# Patient Record
Sex: Male | Born: 1965
Health system: Southern US, Community
[De-identification: ages and names within clinical notes are randomized; demographics above are authoritative.]

## PROBLEM LIST (undated history)

## (undated) DIAGNOSIS — I4891 Unspecified atrial fibrillation: Principal | ICD-10-CM

## (undated) DIAGNOSIS — K5792 Diverticulitis of intestine, part unspecified, without perforation or abscess without bleeding: Secondary | ICD-10-CM

## (undated) DIAGNOSIS — G4730 Sleep apnea, unspecified: Secondary | ICD-10-CM

## (undated) DIAGNOSIS — E785 Hyperlipidemia, unspecified: Secondary | ICD-10-CM

## (undated) HISTORY — DX: Unspecified atrial fibrillation: I48.91

## (undated) HISTORY — PX: MOLE REMOVAL: SHX2046

## (undated) HISTORY — DX: Sleep apnea, unspecified: G47.30

## (undated) HISTORY — PX: TONSILLECTOMY: SUR1361

## (undated) HISTORY — DX: Diverticulitis of intestine, part unspecified, without perforation or abscess without bleeding: K57.92

## (undated) HISTORY — PX: NASAL SINUS SURGERY: SHX719

## (undated) HISTORY — DX: Hyperlipidemia, unspecified: E78.5

## (undated) HISTORY — PX: MANDIBLE FRACTURE SURGERY: SHX706

---

## 2008-09-15 ENCOUNTER — Emergency Department: Payer: Self-pay | Admitting: Emergency Medicine

## 2010-05-28 ENCOUNTER — Ambulatory Visit: Payer: Self-pay | Admitting: Otolaryngology

## 2010-06-06 ENCOUNTER — Ambulatory Visit: Payer: Self-pay | Admitting: Otolaryngology

## 2010-06-23 ENCOUNTER — Emergency Department: Payer: Self-pay | Admitting: Emergency Medicine

## 2010-06-28 ENCOUNTER — Ambulatory Visit: Payer: Self-pay | Admitting: General Practice

## 2011-07-21 ENCOUNTER — Observation Stay: Payer: Self-pay | Admitting: Internal Medicine

## 2011-07-21 DIAGNOSIS — I4891 Unspecified atrial fibrillation: Secondary | ICD-10-CM

## 2011-07-25 ENCOUNTER — Encounter: Payer: Self-pay | Admitting: Cardiovascular Disease

## 2011-07-25 ENCOUNTER — Encounter: Payer: Self-pay | Admitting: *Deleted

## 2011-07-25 ENCOUNTER — Ambulatory Visit (INDEPENDENT_AMBULATORY_CARE_PROVIDER_SITE_OTHER): Payer: BC Managed Care – PPO | Admitting: Cardiovascular Disease

## 2011-07-25 VITALS — BP 111/72 | HR 62 | Ht 70.0 in | Wt 214.0 lb

## 2011-07-25 DIAGNOSIS — I4891 Unspecified atrial fibrillation: Secondary | ICD-10-CM

## 2011-07-25 DIAGNOSIS — G4733 Obstructive sleep apnea (adult) (pediatric): Secondary | ICD-10-CM | POA: Insufficient documentation

## 2011-07-25 DIAGNOSIS — E785 Hyperlipidemia, unspecified: Secondary | ICD-10-CM | POA: Insufficient documentation

## 2011-07-25 NOTE — Patient Instructions (Addendum)
You are doing well. Hold cardizem and pradaxa for now and restart if pulse is irregular Please call us if you have new issues that need to be addressed before your next appt.  We will call you for a follow up Appt. In 12 months

## 2011-07-25 NOTE — Assessment & Plan Note (Signed)
Would continue the cholesterol medication.

## 2011-07-25 NOTE — Assessment & Plan Note (Addendum)
We did talk about his possible obstructive sleep apnea. We have suggested he contact his insurance company and find out which sleep centers would be covered If you would like to pursue a sleep study. The sleep disorder is a possible cause of his atrial fibrillation. His wife reports he has significant snoring.

## 2011-07-25 NOTE — Assessment & Plan Note (Addendum)
No further episodes of atrial fibrillation. He would like to hold his new medications including Cardizem and pradaxa. We have suggested if he holds the Cardizem, that he closely monitor his heart rate for paroxysmal atrial fibrillation. He should restart the anticoagulation And Cardizem if he has an irregular pulse And call our office.  There is no restrictions for his activities and he can go back to work at any time. No further workup is needed at this time.

## 2011-07-25 NOTE — Progress Notes (Signed)
   Patient ID: Perry Richardson, male    DOB: 07-Aug-1966, 45 y.o.   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 Cardiology was consult in for atrial fibrillation. Suspected history of obstructive sleep apnea.  During his hospital course, it was felt that he had been in atrial fibrillation for at least 24 hours. He had felt some palpitations, shortness of breath with exertion the day prior. No prior history of tachyarrhythmias. He was started on diltiazem infusion and changed to p.o. Cardizem in the morning. he converted to sinus rhythm shortly after. On admission, in the emergency room, he had had a vagal response to a blood draw.  He presents today for routine followup. He denies any palpitations since discharge. Otherwise he feels well. He would like to discontinue the medications.  Echocardiogram in the hospital was essentially normal with normal LV systolic function/  EKG today shows normal sinus rhythm with rate 62 beats per minute with no significant ST or T wave changes   Outpatient Encounter Prescriptions as of 07/25/2011  Medication Sig Dispense Refill  . AXIRON 30 MG/ACT SOLN as directed.      . dabigatran (PRADAXA) 150 MG CAPS Take 150 mg by mouth every 12 (twelve) hours.        Marland Kitchen diltiazem (CARDIZEM) 120 MG tablet Take 120 mg by mouth daily.        . fluticasone (FLONASE) 50 MCG/ACT nasal spray Place 2 sprays into the nose daily.        . simvastatin (ZOCOR) 10 MG tablet Take 1 tablet by mouth Daily.         Review of Systems  Constitutional: Negative.   HENT: Negative.   Eyes: Negative.   Respiratory: Negative.   Cardiovascular: Negative.   Gastrointestinal: Negative.   Musculoskeletal: Negative.   Skin: Negative.   Neurological: Negative.   Hematological: Negative.   Psychiatric/Behavioral: Negative.   All other systems reviewed and are  negative.    BP 111/72  Pulse 62  Ht 5\' 10"  (1.778 m)  Wt 214 lb (97.07 kg)  BMI 30.71 kg/m2  Physical Exam  Nursing note and vitals reviewed. Constitutional: He is oriented to person, place, and time. He appears well-developed and well-nourished.  HENT:  Head: Normocephalic.  Nose: Nose normal.  Mouth/Throat: Oropharynx is clear and moist.  Eyes: Conjunctivae are normal. Pupils are equal, round, and reactive to light.  Neck: Normal range of motion. Neck supple. No JVD present.  Cardiovascular: Normal rate, regular rhythm, S1 normal, S2 normal, normal heart sounds and intact distal pulses.  Exam reveals no gallop and no friction rub.   No murmur heard. Pulmonary/Chest: Effort normal and breath sounds normal. No respiratory distress. He has no wheezes. He has no rales. He exhibits no tenderness.  Abdominal: Soft. Bowel sounds are normal. He exhibits no distension. There is no tenderness.  Musculoskeletal: Normal range of motion. He exhibits no edema and no tenderness.  Lymphadenopathy:    He has no cervical adenopathy.  Neurological: He is alert and oriented to person, place, and time. Coordination normal.  Skin: Skin is warm and dry. No rash noted. No erythema.  Psychiatric: He has a normal mood and affect. His behavior is normal. Judgment and thought content normal.           Assessment and Plan

## 2011-08-01 ENCOUNTER — Encounter: Payer: Self-pay | Admitting: Cardiovascular Disease

## 2012-12-29 ENCOUNTER — Ambulatory Visit: Payer: Self-pay | Admitting: General Practice

## 2013-01-19 ENCOUNTER — Ambulatory Visit: Payer: Self-pay | Admitting: General Practice

## 2013-06-23 ENCOUNTER — Encounter: Payer: Self-pay | Admitting: Cardiovascular Disease

## 2013-06-23 ENCOUNTER — Ambulatory Visit (INDEPENDENT_AMBULATORY_CARE_PROVIDER_SITE_OTHER): Payer: BC Managed Care – PPO | Admitting: Cardiovascular Disease

## 2013-06-23 VITALS — BP 112/86 | HR 56 | Ht 70.0 in | Wt 219.8 lb

## 2013-06-23 DIAGNOSIS — R002 Palpitations: Secondary | ICD-10-CM

## 2013-06-23 DIAGNOSIS — G4733 Obstructive sleep apnea (adult) (pediatric): Secondary | ICD-10-CM

## 2013-06-23 DIAGNOSIS — E785 Hyperlipidemia, unspecified: Secondary | ICD-10-CM

## 2013-06-23 DIAGNOSIS — I4891 Unspecified atrial fibrillation: Secondary | ICD-10-CM

## 2013-06-23 NOTE — Assessment & Plan Note (Addendum)
No further episodes of atrial fibrillation. Recent palpitations likely unrelated to atrial fibrillation. We have offered Holter monitor and monitoring using his smartphone. He will manually check his rhythm for now. He is not interested in any medications for palpitations. He will call us in the office if he would like a Holter monitor.

## 2013-06-23 NOTE — Assessment & Plan Note (Signed)
He reports that he has routine lab work through the [facility]. We'll try to obtain a copy of these results when they become available.

## 2013-06-23 NOTE — Progress Notes (Signed)
Patient ID: Perry Richardson, male    DOB: 1965-12-08, 47 y.o.   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 Cardiology was consult in for atrial fibrillation. Suspected history of obstructive sleep apnea.  During his hospital course, it was felt that he had been in atrial fibrillation for at least 24 hours. He had felt some palpitations, shortness of breath with exertion the day prior. No prior history of tachyarrhythmias. He was started on diltiazem infusion and changed to p.o. Cardizem in the morning. he converted to sinus rhythm shortly after. On admission, in the emergency room, he had had a vagal response to a blood draw.  He presents today for routine followup. He presents with his wife. He reports occasional palpitations and fluttering in his chest. One episode of a strong, faster heart rate only lasting for a short period of time. Very rare episodes. He is able to workout without any significant symptoms. He has felt better in the past 3 weeks after making the appointment. Otherwise he feels well. On his last clinic visit, he wished to discontinue many of his medications and this was done. Typically his heart rate runs low  Previous Echocardiogram in the hospital was essentially normal with normal LV systolic function  EKG today shows normal sinus rhythm with rate 56 beats per minute with no significant ST or T wave changes   Outpatient Encounter Prescriptions as of 06/23/2013  Medication Sig Dispense Refill  . aspirin 81 MG tablet Take 81 mg by mouth daily.      Perry Richardson 30 MG/ACT SOLN as directed.      . finasteride (PROSCAR) 5 MG tablet Take 5 mg by mouth daily.       . fluticasone (FLONASE) 50 MCG/ACT nasal spray Place 2 sprays into the nose daily.        . NON FORMULARY CPAP machine      . simvastatin (ZOCOR) 10 MG tablet Take 1 tablet by mouth Daily.      .  [DISCONTINUED] dabigatran (PRADAXA) 150 MG CAPS Take 150 mg by mouth every 12 (twelve) hours.        . [DISCONTINUED] diltiazem (CARDIZEM) 120 MG tablet Take 120 mg by mouth daily.         No facility-administered encounter medications on file as of 06/23/2013.    Review of Systems  Constitutional: Negative.   HENT: Negative.   Eyes: Negative.   Respiratory: Negative.   Cardiovascular: Positive for palpitations.  Gastrointestinal: Negative.   Musculoskeletal: Negative.   Skin: Negative.   Neurological: Negative.   Psychiatric/Behavioral: Negative.   All other systems reviewed and are negative.    BP 112/86  Pulse 56  Ht 5\' 10"  (1.778 m)  Wt 219 lb 12 oz (99.678 kg)  BMI 31.53 kg/m2  Physical Exam  Nursing note and vitals reviewed. Constitutional: He is oriented to person, place, and time. He appears well-developed and well-nourished.  HENT:  Head: Normocephalic.  Nose: Nose normal.  Mouth/Throat: Oropharynx is clear and moist.  Eyes: Conjunctivae are normal. Pupils are equal, round, and reactive to light.  Neck: Normal range of motion. Neck supple. No JVD present.  Cardiovascular: Normal rate, regular rhythm, S1 normal, S2 normal, normal heart sounds and intact distal pulses.  Exam reveals no gallop and no friction rub.   No murmur heard. Pulmonary/Chest: Effort normal and breath sounds normal.  No respiratory distress. He has no wheezes. He has no rales. He exhibits no tenderness.  Abdominal: Soft. Bowel sounds are normal. He exhibits no distension. There is no tenderness.  Musculoskeletal: Normal range of motion. He exhibits no edema and no tenderness.  Lymphadenopathy:    He has no cervical adenopathy.  Neurological: He is alert and oriented to person, place, and time. Coordination normal.  Skin: Skin is warm and dry. No rash noted. No erythema.  Psychiatric: He has a normal mood and affect. His behavior is normal. Judgment and thought content normal.       Assessment and Plan

## 2013-06-23 NOTE — Assessment & Plan Note (Signed)
Currently tolerating CPAP well

## 2013-06-23 NOTE — Patient Instructions (Addendum)
You are doing well. No medication changes were made.  Please call if you like a holter monitor fo arrhythmia  Please call us if you have new issues that need to be addressed before your next appt.  Your physician wants you to follow-up in: 12 months.  You will receive a reminder letter in the mail two months in advance. If you don't receive a letter, please call our office to schedule the follow-up appointment.

## 2013-07-13 DIAGNOSIS — I4891 Unspecified atrial fibrillation: Secondary | ICD-10-CM

## 2013-07-13 HISTORY — DX: Unspecified atrial fibrillation: I48.91

## 2014-01-10 ENCOUNTER — Ambulatory Visit: Payer: Self-pay | Admitting: Neurology

## 2014-08-14 DIAGNOSIS — R413 Other amnesia: Secondary | ICD-10-CM | POA: Insufficient documentation

## 2014-08-14 DIAGNOSIS — G250 Essential tremor: Secondary | ICD-10-CM | POA: Insufficient documentation

## 2014-09-13 ENCOUNTER — Encounter: Payer: Self-pay | Admitting: Cardiovascular Disease

## 2014-09-13 ENCOUNTER — Ambulatory Visit (INDEPENDENT_AMBULATORY_CARE_PROVIDER_SITE_OTHER): Payer: BC Managed Care – PPO | Admitting: Cardiovascular Disease

## 2014-09-13 VITALS — BP 118/82 | HR 54 | Ht 70.0 in | Wt 225.8 lb

## 2014-09-13 DIAGNOSIS — I4891 Unspecified atrial fibrillation: Secondary | ICD-10-CM

## 2014-09-13 DIAGNOSIS — E785 Hyperlipidemia, unspecified: Secondary | ICD-10-CM

## 2014-09-13 NOTE — Patient Instructions (Signed)
You are doing well. No medication changes were made.  Please call us if you have new issues that need to be addressed before your next appt.  Your physician wants you to follow-up in: 12 months.  You will receive a reminder letter in the mail two months in advance. If you don't receive a letter, please call our office to schedule the follow-up appointment. 

## 2014-09-13 NOTE — Assessment & Plan Note (Signed)
No further episodes of arrhythmia. No further workup at this time

## 2014-09-13 NOTE — Progress Notes (Signed)
Patient ID: KRITHIK MAPEL, male    DOB: 03-06-1966, 48 y.o.   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 Cardiology was consult in for atrial fibrillation. Suspected history of obstructive sleep apnea. He presents today for routine follow-up of arrhythmia, blood pressure  In general he feels well and has no complaints. Denies having any symptoms concerning for atrial fibrillation. Occasional palpitation, possibly from ectopy He is active, weight has been stable. Lab work was reviewed with him. Total cholesterol improved from 190 down to 184. He has 3 children, stays active He is tolerating low-dose simvastatin  typically heart rate runs low, he is asymptomatic  EKG shows normal sinus rhythm with rate 54 bpm, no significant ST or T-wave changes  Other past medical history During his hospital course, it was felt that he had been in atrial fibrillation for at least 24 hours. He had felt some palpitations, shortness of breath with exertion the day prior. No prior history of tachyarrhythmias. He was started on diltiazem infusion and changed to p.o. Cardizem in the morning. he converted to sinus rhythm shortly after. On admission, in the emergency room, he had had a vagal response to a blood draw. Previous Echocardiogram in the hospital was essentially normal with normal LV systolic function    Outpatient Encounter Prescriptions as of 09/13/2014  Medication Sig  . aspirin 81 MG tablet Take 81 mg by mouth daily.  [name] 30 MG/ACT SOLN as directed.  . Cholecalciferol (VITAMIN D) 400 UNITS capsule Take 400 Units by mouth daily.   . Cyanocobalamin (RA VITAMIN B-12 TR) 1000 MCG TBCR Take 1,000 mcg by mouth daily.   . finasteride (PROSCAR) 5 MG tablet Take 5 mg by mouth daily.   . fluticasone (FLONASE) 50 MCG/ACT nasal spray Place 2 sprays into the nose daily.    . NON  FORMULARY CPAP machine  . simvastatin (ZOCOR) 10 MG tablet Take 1 tablet by mouth Daily.    Review of Systems  Constitutional: Negative.   Respiratory: Negative.   Cardiovascular: Positive for palpitations.  Gastrointestinal: Negative.   Musculoskeletal: Negative.   Neurological: Negative.   Psychiatric/Behavioral: Negative.   All other systems reviewed and are negative.    BP 118/82 mmHg  Pulse 54  Ht 5\' 10"  (1.778 m)  Wt 225 lb 12 oz (102.4 kg)  BMI 32.39 kg/m2  Physical Exam  Constitutional: He is oriented to person, place, and time. He appears well-developed and well-nourished.  HENT:  Head: Normocephalic.  Nose: Nose normal.  Mouth/Throat: Oropharynx is clear and moist.  Eyes: Conjunctivae are normal. Pupils are equal, round, and reactive to light.  Neck: Normal range of motion. Neck supple. No JVD present.  Cardiovascular: Normal rate, regular rhythm, S1 normal, S2 normal, normal heart sounds and intact distal pulses.  Exam reveals no gallop and no friction rub.   No murmur heard. Pulmonary/Chest: Effort normal and breath sounds normal. No respiratory distress. He has no wheezes. He has no rales. He exhibits no tenderness.  Abdominal: Soft. Bowel sounds are normal. He exhibits no distension. There is no tenderness.  Musculoskeletal: Normal range of motion. He exhibits no edema or tenderness.  Lymphadenopathy:    He has no cervical adenopathy.  Neurological: He is alert and oriented to person, place, and time. Coordination normal.  Skin: Skin is warm and dry. No rash noted. No erythema.  Psychiatric:  He has a normal mood and affect. His behavior is normal. Judgment and thought content normal.      Assessment and Plan   Nursing note and vitals reviewed.

## 2014-09-13 NOTE — Assessment & Plan Note (Signed)
Cholesterol has improved over the past year. We'll continue simvastatin 10 mg daily. We did discuss increasing up to 20 minute grams daily. He is not particularly interested at this time

## 2015-03-25 ENCOUNTER — Encounter (HOSPITAL_COMMUNITY): Payer: Self-pay | Admitting: *Deleted

## 2015-03-25 ENCOUNTER — Emergency Department (HOSPITAL_COMMUNITY)
Admission: EM | Admit: 2015-03-25 | Discharge: 2015-03-25 | Disposition: A | Discharge status: Home / Self Care | Payer: BLUE CROSS/BLUE SHIELD | Attending: Emergency Medicine | Admitting: Emergency Medicine

## 2015-03-25 ENCOUNTER — Emergency Department (HOSPITAL_COMMUNITY): Payer: BLUE CROSS/BLUE SHIELD

## 2015-03-25 DIAGNOSIS — Z79899 Other long term (current) drug therapy: Secondary | ICD-10-CM | POA: Insufficient documentation

## 2015-03-25 DIAGNOSIS — Z7982 Long term (current) use of aspirin: Secondary | ICD-10-CM | POA: Insufficient documentation

## 2015-03-25 DIAGNOSIS — R61 Generalized hyperhidrosis: Secondary | ICD-10-CM | POA: Diagnosis not present

## 2015-03-25 DIAGNOSIS — R55 Syncope and collapse: Secondary | ICD-10-CM | POA: Diagnosis not present

## 2015-03-25 DIAGNOSIS — Z8669 Personal history of other diseases of the nervous system and sense organs: Secondary | ICD-10-CM | POA: Diagnosis not present

## 2015-03-25 DIAGNOSIS — E785 Hyperlipidemia, unspecified: Secondary | ICD-10-CM | POA: Diagnosis not present

## 2015-03-25 DIAGNOSIS — R1013 Epigastric pain: Secondary | ICD-10-CM

## 2015-03-25 DIAGNOSIS — Z7951 Long term (current) use of inhaled steroids: Secondary | ICD-10-CM | POA: Insufficient documentation

## 2015-03-25 DIAGNOSIS — R079 Chest pain, unspecified: Secondary | ICD-10-CM | POA: Insufficient documentation

## 2015-03-25 DIAGNOSIS — I4891 Unspecified atrial fibrillation: Secondary | ICD-10-CM | POA: Insufficient documentation

## 2015-03-25 LAB — LIPASE, BLOOD: Lipase: 30 U/L (ref 22–51)

## 2015-03-25 LAB — BASIC METABOLIC PANEL
ANION GAP: 11 (ref 5–15)
BUN: 19 mg/dL (ref 6–20)
CALCIUM: 8.9 mg/dL (ref 8.9–10.3)
CO2: 24 mmol/L (ref 22–32)
CREATININE: 1.28 mg/dL — AB (ref 0.61–1.24)
Chloride: 104 mmol/L (ref 101–111)
GFR calc non Af Amer: >60 mL/min (ref >60)
GLUCOSE: 113 mg/dL — AB (ref 65–99)
Potassium: 3.2 mmol/L — ABNORMAL LOW (ref 3.5–5.1)
Sodium: 139 mmol/L (ref 135–145)

## 2015-03-25 LAB — CBC WITH DIFFERENTIAL/PLATELET
Basophils Absolute: 0.1 10*3/uL (ref 0.0–0.1)
Basophils Relative: 1 % (ref 0–1)
Eosinophils Absolute: 0.2 10*3/uL (ref 0.0–0.7)
Eosinophils Relative: 4 % (ref 0–5)
HCT: 44 % (ref 39.0–52.0)
HEMOGLOBIN: 15.2 g/dL (ref 13.0–17.0)
LYMPHS ABS: 1.9 10*3/uL (ref 0.7–4.0)
Lymphocytes Relative: 33 % (ref 12–46)
MCH: 31.1 pg (ref 26.0–34.0)
MCHC: 34.5 g/dL (ref 30.0–36.0)
MCV: 90.2 fL (ref 78.0–100.0)
MONO ABS: 0.4 10*3/uL (ref 0.1–1.0)
Monocytes Relative: 7 % (ref 3–12)
Neutro Abs: 3.2 10*3/uL (ref 1.7–7.7)
Neutrophils Relative %: 55 % (ref 43–77)
Platelets: 168 10*3/uL (ref 150–400)
RBC: 4.88 MIL/uL (ref 4.22–5.81)
RDW: 12.8 % (ref 11.5–15.5)
WBC: 5.7 10*3/uL (ref 4.0–10.5)

## 2015-03-25 LAB — TROPONIN I

## 2015-03-25 MED ORDER — OMEPRAZOLE 20 MG PO CPDR: 20.0000 mg | DELAYED_RELEASE_CAPSULE | Freq: Every day | ORAL | Status: DC

## 2015-03-25 MED ORDER — GI COCKTAIL ~~LOC~~
30.0000 mL | Freq: Once | ORAL | Status: AC
  Administered 2015-03-25: 30 mL via ORAL
  Filled 2015-03-25: qty 30

## 2015-03-25 NOTE — ED Notes (Signed)
Called lab, they are adding on lipase.

## 2015-03-25 NOTE — ED Provider Notes (Signed)
CSN: [id number]     Arrival date & time 03/25/15  1848 History   First MD Initiated Contact with Patient 03/25/15 2110     Chief Complaint  Patient presents with  . Chest Pain     (Consider location/radiation/quality/duration/timing/severity/associated sxs/prior Treatment) Patient is a 49 y.o. male presenting with chest pain. The history is provided by the patient.  Chest Pain Pain location:  Epigastric Pain quality: aching   Pain radiates to:  Does not radiate Pain radiates to the back: no   Pain severity:  Moderate Onset quality:  Gradual (6:15 pm) Duration:  3 hours Timing:  Constant Progression:  Worsening Chronicity:  New Context: at rest   Context: not breathing and no trauma   Context comment:  S/p beer drinking Relieved by:  Nothing Worsened by:  Nothing tried Ineffective treatments:  None tried Associated symptoms: diaphoresis (en route to ED), heartburn and near-syncope   Associated symptoms: no fever and no weakness   Risk factors: high cholesterol, hypertension and male sex   Risk factors: no coronary artery disease     Past Medical History  Diagnosis Date  . Atrial fibrillation   . Hyperlipidemia   . Sleep apnea    History reviewed. No pertinent past surgical history. Family History  Problem Relation Age of Onset  . Family history unknown: Yes   History  Substance Use Topics  . Smoking status: Never Smoker   . Smokeless tobacco: Not on file  . Alcohol Use: Yes     Comment: rare    Review of Systems  Constitutional: Positive for diaphoresis (en route to ED). Negative for fever.  Cardiovascular: Positive for chest pain and near-syncope.  Gastrointestinal: Positive for heartburn.  Neurological: Negative for weakness.  All other systems reviewed and are negative.     Allergies  Review of patient's allergies indicates no known allergies.  Home Medications   Prior to Admission medications   Medication Sig Start Date End Date Taking?  Authorizing Provider  aspirin 81 MG tablet Take 81 mg by mouth daily.    Historical Provider, MD  AXIRON 30 MG/ACT SOLN as directed. 07/10/11   Historical Provider, MD  Cholecalciferol (VITAMIN D) 400 UNITS capsule Take 400 Units by mouth daily.     Historical Provider, MD  Cyanocobalamin (RA VITAMIN B-12 TR) 1000 MCG TBCR Take 1,000 mcg by mouth daily.     Historical Provider, MD  finasteride (PROSCAR) 5 MG tablet Take 5 mg by mouth daily.  06/17/13   Historical Provider, MD  fluticasone (FLONASE) 50 MCG/ACT nasal spray Place 2 sprays into the nose daily.      Historical Provider, MD  NON FORMULARY CPAP machine    Historical Provider, MD  omeprazole (PRILOSEC) 20 MG capsule Take 1 capsule (20 mg total) by mouth daily. 03/25/15   Leo Grosser, MD  simvastatin (ZOCOR) 10 MG tablet Take 1 tablet by mouth Daily. 07/18/11   Historical Provider, MD   BP 103/57 mmHg  Pulse 109  Temp(Src) 97.4 F (36.3 C)  Resp 16  Ht 5\' 10"  (1.778 m)  Wt 220 lb (99.791 kg)  BMI 31.57 kg/m2  SpO2 100% Physical Exam  Constitutional: He is oriented to person, place, and time. He appears well-developed and well-nourished. No distress.  HENT:  Head: Normocephalic and atraumatic.  Eyes: Conjunctivae are normal.  Neck: Neck supple. No tracheal deviation present.  Cardiovascular: Normal rate and regular rhythm.   Pulmonary/Chest: Effort normal. No respiratory distress.  Abdominal: Soft. He exhibits no  distension.  Neurological: He is alert and oriented to person, place, and time.  Skin: Skin is warm and dry.  Psychiatric: He has a normal mood and affect.    ED Course  Procedures (including critical care time) Labs Review Labs Reviewed  BASIC METABOLIC PANEL - Abnormal; Notable for the following:    Potassium 3.2 (*)    Glucose, Bld 113 (*)    Creatinine, Ser 1.28 (*)    All other components within normal limits  TROPONIN I  CBC WITH DIFFERENTIAL/PLATELET  LIPASE, BLOOD  CBC WITH DIFFERENTIAL/PLATELET     Imaging Review Dg Chest 2 View  03/25/2015   CLINICAL DATA:  Acute chest pain and nausea today.  EXAM: CHEST  2 VIEW  COMPARISON:  None.  FINDINGS: The cardiomediastinal silhouette is unremarkable.  There is no evidence of focal airspace disease, pulmonary edema, suspicious pulmonary nodule/mass, pleural effusion, or pneumothorax. No acute bony abnormalities are identified.  IMPRESSION: No active cardiopulmonary disease.   Electronically Signed   By: Margarette Canada M.D.   On: 03/25/2015 19:59   I independently viewed and interpreted the above radiology studies and agree with radiologist report.   EKG Interpretation   Date/Time:  Sunday March 25 2015 18:53:02 EDT Ventricular Rate:  52 PR Interval:  158 QRS Duration: 90 QT Interval:  434 QTC Calculation: 403 R Axis:   65 Text Interpretation:  Sinus bradycardia Nonspecific T wave abnormality  Abnormal ECG When compared with ECG of 07/20/2011, Sinus rhythm has  replaced Atrial fibrillation Nonspecific T wave abnormality is now Present  HEART RATE has decreased Confirmed by [facility]  MD, DAVID ([id number]) on  03/25/2015 9:57:08 PM      MDM   Final diagnoses:  Epigastric abdominal pain    49 year old male presents after drinking a beer at a baseball game and getting sudden onset upper abdominal sharp pain that happened gradually and worsened. He has noted this pain twice previously, once while drinking beer and another time while exercising.  Symptoms concerning for reflux esophagitis, peptic ulcer disease, or pancreatitis. Lipase is normal, pain is not related to intake of food, pain is extremely atypical for ACS and patient has a negative troponin on arrival and no ischemic findings on EKG while having symptoms. Reflux esophagitis or other pain related to beer intake is much higher likelihood. The patient will be advised to stop beer consumption and start a PPI empirically. Plan to follow up with PCP as needed and return precautions discussed for  worsening or new concerning symptoms.   Leo Grosser, MD 22/63/33 [id number]  Delora Fuel, MD 25/63/89 [id number]

## 2015-03-25 NOTE — ED Notes (Signed)
The pt is c/o abd pain with radiation up into his chest for approx one hour with nausea.  He mowed his yard this am and this afternoon he was watching a ballgame and he had 2-3 beers to drink.Perry Richardson  Hx af  Not tachy

## 2015-03-25 NOTE — ED Notes (Signed)
Pt reports mid epigastric and upper abdominal pain for several weeks. Off and on instances of sharp chest pain causing diaphoresis, weakness, some shortness of breath and nausea after drinking alcohol several times throughout the past few weeks.

## 2015-03-25 NOTE — Discharge Instructions (Signed)

## 2015-03-26 ENCOUNTER — Telehealth: Payer: Self-pay | Admitting: General Surgery

## 2015-03-26 NOTE — Telephone Encounter (Addendum)
03-26-15 PT CALLED TODAY & WANTED TO MAKE AN APPT FOR A COLONOSCOPY(AGE/NON-PRIOR) & WAS SEEN IN [facility] ER 03-25-15(IN [facility]) HE HAS BEEN HAVING UPPER GASTRIC & CHEST PRESSURE WITH NAUSEA & SWEATING.HE WOULD LIKE TO SEE YOU FOR BOTH OF THESE.THIS IS CATERINE Bouch(HAPPY) SPOUSE & LAST SEEN  BY YOU IN 2002.CAN WE MAKE APPT?

## 2015-03-27 ENCOUNTER — Encounter: Payer: Self-pay | Admitting: General Surgery

## 2015-03-27 NOTE — Telephone Encounter (Signed)
OK 

## 2015-04-04 ENCOUNTER — Ambulatory Visit (INDEPENDENT_AMBULATORY_CARE_PROVIDER_SITE_OTHER): Payer: BLUE CROSS/BLUE SHIELD | Admitting: General Surgery

## 2015-04-04 ENCOUNTER — Encounter: Payer: Self-pay | Admitting: General Surgery

## 2015-04-04 DIAGNOSIS — K224 Dyskinesia of esophagus: Secondary | ICD-10-CM

## 2015-04-04 DIAGNOSIS — Z8371 Family history of colonic polyps: Secondary | ICD-10-CM | POA: Diagnosis not present

## 2015-04-04 MED ORDER — HYOSCYAMINE SULFATE 0.125 MG SL SUBL: 0.1250 mg | SUBLINGUAL_TABLET | SUBLINGUAL | Status: DC | PRN

## 2015-04-04 MED ORDER — POLYETHYLENE GLYCOL 3350 17 GM/SCOOP PO POWD: ORAL | Status: DC

## 2015-04-04 NOTE — Patient Instructions (Addendum)
Esophagogastroduodenoscopy Esophagogastroduodenoscopy (EGD) is a procedure to examine the lining of the esophagus, stomach, and first part of the small intestine (duodenum). A long, flexible, lighted tube with a camera attached (endoscope) is inserted down the throat to view these organs. This procedure is done to detect problems or abnormalities, such as inflammation, bleeding, ulcers, or growths, in order to treat them. The procedure lasts about 5-20 minutes. It is usually an outpatient procedure, but it may need to be performed in emergency cases in the hospital. LET YOUR CAREGIVER KNOW ABOUT:   Allergies to food or medicine.  All medicines you are taking, including vitamins, herbs, eyedrops, and over-the-counter medicines and creams.  Use of steroids (by mouth or creams).  Previous problems you or members of your family have had with the use of anesthetics.  Any blood disorders you have.  Previous surgeries you have had.  Other health problems you have.  Possibility of pregnancy, if this applies. RISKS AND COMPLICATIONS  Generally, EGD is a safe procedure. However, as with any procedure, complications can occur. Possible complications include:  Infection.  Bleeding.  Tearing (perforation) of the esophagus, stomach, or duodenum.  Difficulty breathing or not being able to breath.  Excessive sweating.  Spasms of the larynx.  Slowed heartbeat.  Low blood pressure. BEFORE THE PROCEDURE  Do not eat or drink anything for 6-8 hours before the procedure or as directed by your caregiver.  Ask your caregiver about changing or stopping your regular medicines.  If you wear dentures, be prepared to remove them before the procedure.  Arrange for someone to drive you home after the procedure. PROCEDURE   A vein will be accessed to give medicines and fluids. A medicine to relax you (sedative) and a pain reliever will be given through that access into the vein.  A numbing medicine  (local anesthetic) may be sprayed on your throat for comfort and to stop you from gagging or coughing.  A mouth guard may be placed in your mouth to protect your teeth and to keep you from biting on the endoscope.  You will be asked to lie on your left side.  The endoscope is inserted down your throat and into the esophagus, stomach, and duodenum.  Air is put through the endoscope to allow your caregiver to view the lining of your esophagus clearly.  The esophagus, stomach, and duodenum is then examined. During the exam, your caregiver may:  Remove tissue to be examined under a microscope (biopsy) for inflammation, infection, or other medical problems.  Remove growths.  Remove objects (foreign bodies) that are stuck.  Treat any bleeding with medicines or other devices that stop tissues from bleeding (hot cautery, clipping devices).  Widen (dilate) or stretch narrowed areas of the esophagus and stomach.  The endoscope will then be withdrawn. AFTER THE PROCEDURE  You will be taken to a recovery area to be monitored. You will be able to go home once you are stable and alert.  Do not eat or drink anything until the local anesthetic and numbing medicines have worn off. You may choke.  It is normal to feel bloated, have pain with swallowing, or have a sore throat for a short time. This will wear off.  Your caregiver should be able to discuss his or her findings with you. It will take longer to discuss the test results if any biopsies were taken. Document Released: 01/30/2005 Document Revised: 02/13/2014 Document Reviewed: 09/01/2012 [facility] Patient Information 2015 [place], [place]. This information is not  intended to replace advice given to you by your health care provider. Make sure you discuss any questions you have with your health care provider.     Colonoscopy A colonoscopy is an exam to look at the entire large intestine (colon). This exam can help find problems such as  tumors, polyps, inflammation, and areas of bleeding. The exam takes about 1 hour.  LET [facility] CARE PROVIDER KNOW ABOUT:   Any allergies you have.  All medicines you are taking, including vitamins, herbs, eye drops, creams, and over-the-counter medicines.  Previous problems you or members of your family have had with the use of anesthetics.  Any blood disorders you have.  Previous surgeries you have had.  Medical conditions you have. RISKS AND COMPLICATIONS  Generally, this is a safe procedure. However, as with any procedure, complications can occur. Possible complications include:  Bleeding.  Tearing or rupture of the colon wall.  Reaction to medicines given during the exam.  Infection (rare). BEFORE THE PROCEDURE   Ask your health care provider about changing or stopping your regular medicines.  You may be prescribed an oral bowel prep. This involves drinking a large amount of medicated liquid, starting the day before your procedure. The liquid will cause you to have multiple loose stools until your stool is almost clear or light green. This cleans out your colon in preparation for the procedure.  Do not eat or drink anything else once you have started the bowel prep, unless your health care provider tells you it is safe to do so.  Arrange for someone to drive you home after the procedure. PROCEDURE   You will be given medicine to help you relax (sedative).  You will lie on your side with your knees bent.  A long, flexible tube with a light and camera on the end (colonoscope) will be inserted through the rectum and into the colon. The camera sends video back to a computer screen as it moves through the colon. The colonoscope also releases carbon dioxide gas to inflate the colon. This helps your health care provider see the area better.  During the exam, your health care provider may take a small tissue sample (biopsy) to be examined under a microscope if any  abnormalities are found.  The exam is finished when the entire colon has been viewed. AFTER THE PROCEDURE   Do not drive for 24 hours after the exam.  You may have a small amount of blood in your stool.  You may pass moderate amounts of gas and have mild abdominal cramping or bloating. This is caused by the gas used to inflate your colon during the exam.  Ask when your test results will be ready and how you will get your results. Make sure you get your test results. Document Released: 09/26/2000 Document Revised: 07/20/2013 Document Reviewed: 06/06/2013 [facility] Patient Information 2015 [place], [place]. This information is not intended to replace advice given to you by your health care provider. Make sure you discuss any questions you have with your health care provider.  Patient has been scheduled for a colonoscopy on 05-30-15 at [facility]. It is okay for patient to continue 81 mg aspirin once daily.

## 2015-04-04 NOTE — Progress Notes (Signed)
Patient ID: Perry Richardson, male   DOB: 09-Oct-1966, 49 y.o.   MRN: [id number]  Chief Complaint  Patient presents with  . Colonoscopy    HPI Perry Richardson is a 49 y.o. male.  Here today to discuss having a colonoscopy. He does have upper gastric "spasms" associated with alcohol and heat. States it last 20 minutes with shortness of breath. He states 2 episodes in past month. He did go to [facility] ED and was negative for cardiac issues. He states he does not tolerate greasy foods they tend to trigger "gas" and pressure. Cold coffee triggers irregular bowels for a week. He does admit to hemorrhoids. Bowels are regular and daily, no bleeding.  HPI  Past Medical History  Diagnosis Date  . Atrial fibrillation Oct 2014    one episode  . Hyperlipidemia   . Sleep apnea     CPAP    Past Surgical History  Procedure Laterality Date  . Nasal sinus surgery      x2    Family History  Problem Relation Age of Onset  . Hypertension Father   . COPD Mother   . Cancer Mother     kidney  . Cancer Other 25    niece/rectal cancer  . Colon polyps Daughter 39    colon polyp    Social History History  Substance Use Topics  . Smoking status: Never Smoker   . Smokeless tobacco: Never Used  . Alcohol Use: 0.0 oz/week    0 Standard drinks or equivalent per week     Comment: rare, 2/week    No Known Allergies  Current Outpatient Prescriptions  Medication Sig Dispense Refill  . aspirin 81 MG tablet Take 81 mg by mouth daily.    [name] 30 MG/ACT SOLN as directed.    . Cholecalciferol (VITAMIN D) 400 UNITS capsule Take 400 Units by mouth daily.     . Cyanocobalamin (RA VITAMIN B-12 TR) 1000 MCG TBCR Take 1,000 mcg by mouth daily.     . finasteride (PROSCAR) 5 MG tablet Take 5 mg by mouth daily.     . fluticasone (FLONASE) 50 MCG/ACT nasal spray Place 2 sprays into the nose daily.      . NON FORMULARY CPAP machine    . simvastatin (ZOCOR) 10 MG tablet Take 1 tablet by mouth Daily.    .  hyoscyamine (LEVSIN/SL) 0.125 MG SL tablet Place 1 tablet (0.125 mg total) under the tongue every 4 (four) hours as needed for cramping. 30 tablet 0  . polyethylene glycol powder (GLYCOLAX/MIRALAX) powder 255 grams one bottle for colonoscopy prep 255 g 0   No current facility-administered medications for this visit.    Review of Systems Review of Systems  Respiratory: Negative.   Cardiovascular: Negative.   Gastrointestinal: Negative for vomiting, diarrhea and constipation.    Blood pressure 116/70, pulse 58, resp. rate 12, height 5' 10.5" (1.791 m), weight 223 lb (101.152 kg).  Physical Exam Physical Exam  Constitutional: He is oriented to person, place, and time. He appears well-developed and well-nourished.  HENT:  Mouth/Throat: Oropharynx is clear and moist and mucous membranes are normal.  Eyes: Conjunctivae are normal. No scleral icterus.  Neck: Neck supple.  Cardiovascular: Normal rate, regular rhythm and normal heart sounds.   Pulmonary/Chest: Effort normal and breath sounds normal.  Abdominal: Soft. Normal appearance and bowel sounds are normal. There is no hepatomegaly. There is no tenderness.  Lymphadenopathy:    He has no cervical adenopathy.  Neurological:  He is alert and oriented to person, place, and time.  Skin: Skin is warm and dry.  Psychiatric: He has a normal mood and affect.    Data Reviewed ED evaluation from 03/25/2015 as well as associated laboratory studies were reviewed. The only notable finding was a potassium of 3.2. Normal troponin. Normal ECG. Normal chest x-ray.  Assessment    Clinical history compatible with esophageal spasm.  Family history of colon cancer and colon polyps.    Plan     Discussed risk and benefits of upper endoscopy.  Colonoscopy with possible biopsy/polypectomy prn: Information regarding the procedure, including its potential risks and complications (including but not limited to perforation of the bowel, which may  require emergency surgery to repair, and bleeding) was verbally given to the patient. Educational information regarding lower instestinal endoscopy was given to the patient. Written instructions for how to complete the bowel prep using Miralax were provided. The importance of drinking ample fluids to avoid dehydration as a result of the prep emphasized.    Patient has been scheduled for a colonoscopy on 05-30-15 at [facility]. It is okay for patient to continue 81 mg aspirin once daily.  The patient will contact his brother to determine if his niece, his brother sister, had genetic testing with the identification of colon cancer at age less than 90.  A prescription for Levsin SL was provided to use if he has a recurrent episode of esophageal spasm. He'll report if this is beneficial at the time of his upcoming colonoscopy/upper endoscopy. PCP:  Kandace Parkins 04/05/2015, 6:58 PM

## 2015-04-05 DIAGNOSIS — Z8371 Family history of colonic polyps: Secondary | ICD-10-CM | POA: Insufficient documentation

## 2015-04-05 DIAGNOSIS — K224 Dyskinesia of esophagus: Secondary | ICD-10-CM | POA: Insufficient documentation

## 2015-04-05 NOTE — H&P (Signed)
HPI  Perry Richardson is a 49 y.o. male. Here today to discuss having a colonoscopy. He does have upper gastric "spasms" associated with alcohol and heat. States it last 20 minutes with shortness of breath. He states 2 episodes in past month. He did go to [facility] ED and was negative for cardiac issues.  He states he does not tolerate greasy foods they tend to trigger "gas" and pressure. Cold coffee triggers irregular bowels for a week.  He does admit to hemorrhoids. Bowels are regular and daily, no bleeding.  HPI  Past Medical History   Diagnosis  Date   .  Atrial fibrillation  Oct 2014     one episode   .  Hyperlipidemia    .  Sleep apnea      CPAP    Past Surgical History   Procedure  Laterality  Date   .  Nasal sinus surgery       x2    Family History   Problem  Relation  Age of Onset   .  Hypertension  Father    .  COPD  Mother    .  Cancer  Mother      kidney   .  Cancer  Other  25     niece/rectal cancer   .  Colon polyps  Daughter  29     colon polyp    Social History  History   Substance Use Topics   .  Smoking status:  Never Smoker   .  Smokeless tobacco:  Never Used   .  Alcohol Use:  0.0 oz/week     0 Standard drinks or equivalent per week      Comment: rare, 2/week    No Known Allergies  Current Outpatient Prescriptions   Medication  Sig  Dispense  Refill   .  aspirin 81 MG tablet  Take 81 mg by mouth daily.     [name] 30 MG/ACT SOLN  as directed.     .  Cholecalciferol (VITAMIN D) 400 UNITS capsule  Take 400 Units by mouth daily.     .  Cyanocobalamin (RA VITAMIN B-12 TR) 1000 MCG TBCR  Take 1,000 mcg by mouth daily.     .  finasteride (PROSCAR) 5 MG tablet  Take 5 mg by mouth daily.     .  fluticasone (FLONASE) 50 MCG/ACT nasal spray  Place 2 sprays into the nose daily.     .  NON FORMULARY  CPAP machine     .  simvastatin (ZOCOR) 10 MG tablet  Take 1 tablet by mouth Daily.     .  hyoscyamine (LEVSIN/SL) 0.125 MG SL tablet  Place 1 tablet (0.125 mg total)  under the tongue every 4 (four) hours as needed for cramping.  30 tablet  0   .  polyethylene glycol powder (GLYCOLAX/MIRALAX) powder  255 grams one bottle for colonoscopy prep  255 g  0    No current facility-administered medications for this visit.    Review of Systems  Review of Systems  Respiratory: Negative.  Cardiovascular: Negative.  Gastrointestinal: Negative for vomiting, diarrhea and constipation.   Blood pressure 116/70, pulse 58, resp. rate 12, height 5' 10.5" (1.791 m), weight 223 lb (101.152 kg).  Physical Exam  Physical Exam  Constitutional: He is oriented to person, place, and time. He appears well-developed and well-nourished.  HENT:  Mouth/Throat: Oropharynx is clear and moist and mucous membranes are normal.  Eyes: Conjunctivae  are normal. No scleral icterus.  Neck: Neck supple.  Cardiovascular: Normal rate, regular rhythm and normal heart sounds.  Pulmonary/Chest: Effort normal and breath sounds normal.  Abdominal: Soft. Normal appearance and bowel sounds are normal. There is no hepatomegaly. There is no tenderness.  Lymphadenopathy:  He has no cervical adenopathy.  Neurological: He is alert and oriented to person, place, and time.  Skin: Skin is warm and dry.  Psychiatric: He has a normal mood and affect.   Data Reviewed  ED evaluation from 03/25/2015 as well as associated laboratory studies were reviewed. The only notable finding was a potassium of 3.2. Normal troponin. Normal ECG. Normal chest x-ray.  Assessment   Clinical history compatible with esophageal spasm.  Family history of colon cancer and colon polyps.   Plan   Discussed risk and benefits of upper endoscopy.  Colonoscopy with possible biopsy/polypectomy prn: Information regarding the procedure, including its potential risks and complications (including but not limited to perforation of the bowel, which may require emergency surgery to repair, and bleeding) was verbally given to the patient.  Educational information regarding lower instestinal endoscopy was given to the patient. Written instructions for how to complete the bowel prep using Miralax were provided. The importance of drinking ample fluids to avoid dehydration as a result of the prep emphasized.   Patient has been scheduled for a colonoscopy on 05-30-15 at [facility]. It is okay for patient to continue 81 mg aspirin once daily.  The patient will contact his brother to determine if his niece, his brother sister, had genetic testing with the identification of colon cancer at age less than 6.  A prescription for Levsin SL was provided to use if he has a recurrent episode of esophageal spasm. He'll report if this is beneficial at the time of his upcoming colonoscopy/upper endoscopy.  PCP: Kandace Parkins

## 2015-05-23 ENCOUNTER — Telehealth: Payer: Self-pay | Admitting: *Deleted

## 2015-05-23 NOTE — Telephone Encounter (Signed)
Message left on cell number for patient to call the office.   We need to confirm no medication changes since last office visit. Also, confirm he has Miralax prescription.   Patient is scheduled for an upper and lower endoscopy on 05-30-15 at [facility].

## 2015-05-23 NOTE — Telephone Encounter (Signed)
Patient confirms no medication changes since last office visit. Also, states he has Miralax prescription.   We will proceed with colonoscopy as scheduled.

## 2015-05-30 ENCOUNTER — Encounter
Admission: RE | Disposition: A | Discharge status: Home / Self Care | Payer: Self-pay | Source: Ambulatory Visit | Attending: General Surgery

## 2015-05-30 ENCOUNTER — Ambulatory Visit: Payer: BLUE CROSS/BLUE SHIELD | Admitting: Anesthesiology

## 2015-05-30 ENCOUNTER — Ambulatory Visit
Admission: RE | Admit: 2015-05-30 | Discharge: 2015-05-30 | Disposition: A | Discharge status: Home / Self Care | Payer: BLUE CROSS/BLUE SHIELD | Source: Ambulatory Visit | Attending: General Surgery | Admitting: General Surgery

## 2015-05-30 ENCOUNTER — Encounter: Payer: Self-pay | Admitting: *Deleted

## 2015-05-30 DIAGNOSIS — G4730 Sleep apnea, unspecified: Secondary | ICD-10-CM | POA: Insufficient documentation

## 2015-05-30 DIAGNOSIS — R1013 Epigastric pain: Secondary | ICD-10-CM | POA: Diagnosis present

## 2015-05-30 DIAGNOSIS — Z825 Family history of asthma and other chronic lower respiratory diseases: Secondary | ICD-10-CM | POA: Diagnosis not present

## 2015-05-30 DIAGNOSIS — Z7982 Long term (current) use of aspirin: Secondary | ICD-10-CM | POA: Insufficient documentation

## 2015-05-30 DIAGNOSIS — Z1211 Encounter for screening for malignant neoplasm of colon: Secondary | ICD-10-CM | POA: Insufficient documentation

## 2015-05-30 DIAGNOSIS — Z9889 Other specified postprocedural states: Secondary | ICD-10-CM | POA: Diagnosis not present

## 2015-05-30 DIAGNOSIS — R1030 Lower abdominal pain, unspecified: Secondary | ICD-10-CM | POA: Diagnosis not present

## 2015-05-30 DIAGNOSIS — E785 Hyperlipidemia, unspecified: Secondary | ICD-10-CM | POA: Diagnosis not present

## 2015-05-30 DIAGNOSIS — Z8249 Family history of ischemic heart disease and other diseases of the circulatory system: Secondary | ICD-10-CM | POA: Diagnosis not present

## 2015-05-30 DIAGNOSIS — D126 Benign neoplasm of colon, unspecified: Secondary | ICD-10-CM | POA: Diagnosis not present

## 2015-05-30 DIAGNOSIS — D123 Benign neoplasm of transverse colon: Secondary | ICD-10-CM | POA: Insufficient documentation

## 2015-05-30 DIAGNOSIS — Z7951 Long term (current) use of inhaled steroids: Secondary | ICD-10-CM | POA: Insufficient documentation

## 2015-05-30 DIAGNOSIS — Z8051 Family history of malignant neoplasm of kidney: Secondary | ICD-10-CM | POA: Diagnosis not present

## 2015-05-30 DIAGNOSIS — Z800 Family history of malignant neoplasm of digestive organs: Secondary | ICD-10-CM | POA: Diagnosis not present

## 2015-05-30 DIAGNOSIS — Z8371 Family history of colonic polyps: Secondary | ICD-10-CM

## 2015-05-30 DIAGNOSIS — I4891 Unspecified atrial fibrillation: Secondary | ICD-10-CM | POA: Diagnosis not present

## 2015-05-30 DIAGNOSIS — K319 Disease of stomach and duodenum, unspecified: Secondary | ICD-10-CM

## 2015-05-30 DIAGNOSIS — Z79899 Other long term (current) drug therapy: Secondary | ICD-10-CM | POA: Diagnosis not present

## 2015-05-30 DIAGNOSIS — D122 Benign neoplasm of ascending colon: Secondary | ICD-10-CM | POA: Diagnosis not present

## 2015-05-30 HISTORY — PX: COLONOSCOPY WITH PROPOFOL: SHX5780

## 2015-05-30 HISTORY — PX: ESOPHAGOGASTRODUODENOSCOPY: SHX5428

## 2015-05-30 SURGERY — COLONOSCOPY WITH PROPOFOL
Anesthesia: General

## 2015-05-30 MED ORDER — FENTANYL CITRATE (PF) 100 MCG/2ML IJ SOLN: 25.0000 ug | INTRAMUSCULAR | Status: DC | PRN

## 2015-05-30 MED ORDER — LIDOCAINE HCL (CARDIAC) 20 MG/ML IV SOLN
INTRAVENOUS | Status: DC | PRN
  Administered 2015-05-30: 60 mg via INTRAVENOUS

## 2015-05-30 MED ORDER — GLYCOPYRROLATE 0.2 MG/ML IJ SOLN
INTRAMUSCULAR | Status: DC | PRN
  Administered 2015-05-30: 0.2 mg via INTRAVENOUS

## 2015-05-30 MED ORDER — PROPOFOL INFUSION 10 MG/ML OPTIME
INTRAVENOUS | Status: DC | PRN
  Administered 2015-05-30: 200 ug/kg/min via INTRAVENOUS

## 2015-05-30 MED ORDER — ONDANSETRON HCL 4 MG/2ML IJ SOLN: 4.0000 mg | Freq: Once | INTRAMUSCULAR | Status: DC | PRN

## 2015-05-30 MED ORDER — PROPOFOL 10 MG/ML IV BOLUS
INTRAVENOUS | Status: DC | PRN
  Administered 2015-05-30: 80 mg via INTRAVENOUS
  Administered 2015-05-30: 20 mg via INTRAVENOUS

## 2015-05-30 MED ORDER — SODIUM CHLORIDE 0.9 % IV SOLN
INTRAVENOUS | Status: DC
  Administered 2015-05-30: 1000 mL via INTRAVENOUS

## 2015-05-30 NOTE — H&P (Signed)
Perry Richardson is a 49 y.o. male. Here today to discuss having a colonoscopy. He has had upper gastric "spasms" associated with alcohol and heat. States it last 20 minutes with shortness of breath. He states 2 episodes in past month. He did go to [facility] ED and was negative for cardiac issues.  Had one episode 4-6 weeks ago, mild compared to past episodes. Levsin did not produce significant change. Lasted aboud 45 minutes. No SOB with this episode.  He states he does not tolerate greasy foods they tend to trigger "gas" and pressure. Cold coffee triggers irregular bowels for a week.  He does admit to hemorrhoids. Bowels are regular and daily, no bleeding.   HPI   Past Medical History   Diagnosis  Date   .  Atrial fibrillation  Oct 2014     one episode   .  Hyperlipidemia    .  Sleep apnea      CPAP    Past Surgical History   Procedure  Laterality  Date   .  Nasal sinus surgery       x2    Family History   Problem  Relation  Age of Onset   .  Hypertension  Father    .  COPD  Mother    .  Cancer  Mother      kidney   .  Cancer  Other  25     niece/rectal cancer   .  Colon polyps  Daughter  17     colon polyp    Social History  History   Substance Use Topics   .  Smoking status:  Never Smoker   .  Smokeless tobacco:  Never Used   .  Alcohol Use:  0.0 oz/week     0 Standard drinks or equivalent per week      Comment: rare, 2/week    No Known Allergies  Current Outpatient Prescriptions   Medication  Sig  Dispense  Refill   .  aspirin 81 MG tablet  Take 81 mg by mouth daily.     [name] 30 MG/ACT SOLN  as directed.     .  Cholecalciferol (VITAMIN D) 400 UNITS capsule  Take 400 Units by mouth daily.     .  Cyanocobalamin (RA VITAMIN B-12 TR) 1000 MCG TBCR  Take 1,000 mcg by mouth daily.     .  finasteride (PROSCAR) 5 MG tablet  Take 5 mg by mouth daily.      .  fluticasone (FLONASE) 50 MCG/ACT nasal spray  Place 2 sprays into the nose daily.     .  NON FORMULARY  CPAP machine     .  simvastatin (ZOCOR) 10 MG tablet  Take 1 tablet by mouth Daily.     .  hyoscyamine (LEVSIN/SL) 0.125 MG SL tablet  Place 1 tablet (0.125 mg total) under the tongue every 4 (four) hours as needed for cramping.  30 tablet  0   .  polyethylene glycol powder (GLYCOLAX/MIRALAX) powder  255 grams one bottle for colonoscopy prep  255 g  0    No current facility-administered medications for this visit.    Review of Systems  Review of Systems  Respiratory: Negative.  Cardiovascular: Negative.  Gastrointestinal: Negative for vomiting, diarrhea and constipation.   Blood pressure 116/70, pulse 58, resp. rate 12, height 5' 10.5" (1.791 m), weight 223 lb (101.152 kg).  Physical Exam  Physical Exam  Constitutional: He is oriented  to person, place, and time. He appears well-developed and well-nourished.  HENT:  Mouth/Throat: Oropharynx is clear and moist and mucous membranes are normal.  Eyes: Conjunctivae are normal. No scleral icterus.  Neck: Neck supple.  Cardiovascular: Normal rate, regular rhythm and normal heart sounds.  Pulmonary/Chest: Effort normal and breath sounds normal.   Data Reviewed  ED evaluation from 03/25/2015 as well as associated laboratory studies were reviewed. The only notable finding was a potassium of 3.2. Normal troponin. Normal ECG. Normal chest x-ray.   Patient reports niece was tested for genetic (Lynch syndrome) and testing was negative.  Assessment   Clinical history compatible with esophageal spasm.  Family history of colon cancer and colon polyps.   Plan   Discussed risk and benefits of upper endoscopy.  Colonoscopy with possible biopsy/polypectomy prn: Information regarding the procedure, including its potential risks and complications (including but not limited to perforation of the  bowel, which may require emergency surgery to repair, and bleeding) was verbally given to the patient. Educational information regarding lower instestinal endoscopy was given to the patient. Written instructions for how to complete the bowel prep using Miralax were provided. The importance of drinking ample fluids to avoid dehydration as a result of the prep emphasized.   Patient has been scheduled for a colonoscopy on 05-30-15 at [facility]. It is okay for patient to continue 81 mg aspirin once daily.  The patient will contact his brother to determine if his niece, his brother sister, had genetic testing with the identification of colon cancer at age less than 59.  A prescription for Levsin SL was provided to use if he has a recurrent episode of esophageal spasm. He'll report if this is beneficial at the time of his upcoming colonoscopy/upper endoscopy.  PCP: Kandace Parkins

## 2015-05-30 NOTE — Anesthesia Postprocedure Evaluation (Signed)
  Anesthesia Post-op Note  Patient: Perry Richardson  Procedure(s) Performed: Procedure(s): COLONOSCOPY WITH PROPOFOL (N/A) ESOPHAGOGASTRODUODENOSCOPY (EGD) (N/A)  Anesthesia type:General  Patient location: PACU  Post pain: Pain level controlled  Post assessment: Post-op Vital signs reviewed, Patient's Cardiovascular Status Stable, Respiratory Function Stable, Patent Airway and No signs of Nausea or vomiting  Post vital signs: Reviewed and stable  Last Vitals:  Filed Vitals:   05/30/15 1100  BP: 112/88  Pulse: 53  Temp:   Resp: 23    Level of consciousness: awake, alert  and patient cooperative  Complications: No apparent anesthesia complications

## 2015-05-30 NOTE — Op Note (Signed)
[facility] Gastroenterology Patient Name: Perry Richardson Procedure Date: 05/30/2015 9:35 AM MRN: [id number] Account #: [id number] Date of Birth: 14-Dec-1965 Admit Type: Outpatient Age: 49 Room: [facility] ENDO ROOM 1 Gender: Male Note Status: Finalized Procedure:         Upper GI endoscopy Indications:       Epigastric abdominal pain Providers:         Robert Bellow, MD Referring MD:      Versie Starks, MD (Referring MD) Medicines:         Monitored Anesthesia Care Complications:     No immediate complications. Procedure:         Pre-Anesthesia Assessment:                    - Prior to the procedure, a History and Physical was                     performed, and patient medications, allergies and                     sensitivities were reviewed. The patient's tolerance of                     previous anesthesia was reviewed.                    - The risks and benefits of the procedure and the sedation                     options and risks were discussed with the patient. All                     questions were answered and informed consent was obtained.                    After obtaining informed consent, the endoscope was passed                     under direct vision. Throughout the procedure, the                     patient's blood pressure, pulse, and oxygen saturations                     were monitored continuously. The Endoscope was introduced                     through the mouth, and advanced to the second part of                     duodenum. The upper GI endoscopy was accomplished without                     difficulty. The patient tolerated the procedure well. Findings:      The esophagus was normal.      A single medium-sized mucosal papule (nodule) with no bleeding and no       stigmata of recent bleeding was found in the prepyloric region of the       stomach. Biopsies were taken with a cold forceps for histology.      The examined duodenum was  normal. Impression:        - Normal esophagus. Biopsy obtained to assess for possible  esophagitis.                    - A single mucosal papule (nodule) found in the stomach.                     Biopsied.                    - Normal examined duodenum. Recommendation:    - Telephone endoscopist for pathology results in 1 week. Procedure Code(s): --- Professional ---                    [phone], Esophagogastroduodenoscopy, flexible, transoral;                     with biopsy, single or multiple Diagnosis Code(s): --- Professional ---                    K31.9, Disease of stomach and duodenum, unspecified                    R10.13, Epigastric pain CPT copyright 2014 American Medical Association. All rights reserved. The codes documented in this report are preliminary and upon coder review may  be revised to meet current compliance requirements. Robert Bellow, MD 05/30/2015 9:58:42 AM This report has been signed electronically. Number of Addenda: 0 Note Initiated On: 05/30/2015 9:35 AM      [facility]

## 2015-05-30 NOTE — Transfer of Care (Signed)
Immediate Anesthesia Transfer of Care Note  Patient: Perry Richardson  Procedure(s) Performed: Procedure(s): COLONOSCOPY WITH PROPOFOL (N/A) ESOPHAGOGASTRODUODENOSCOPY (EGD) (N/A)  Patient Location: PACU  Anesthesia Type:General  Level of Consciousness: awake, alert  and oriented  Airway & Oxygen Therapy: Patient Spontanous Breathing and Patient connected to nasal cannula oxygen  Post-op Assessment: Report given to RN and Post -op Vital signs reviewed and stable  Post vital signs: Reviewed and stable  Last Vitals:  Filed Vitals:   05/30/15 1031  BP: 112/80  Pulse: 60  Temp: 35.9 C  Resp: 16    Complications: No apparent anesthesia complications

## 2015-05-30 NOTE — Anesthesia Preprocedure Evaluation (Signed)
Anesthesia Evaluation  Patient identified by MRN, date of birth, ID band Patient awake    Reviewed: Allergy & Precautions, NPO status , Patient's Chart, lab work & pertinent test results  Airway Mallampati: II       Dental no notable dental hx.    Pulmonary sleep apnea ,    Pulmonary exam normal       Cardiovascular Normal cardiovascular exam+ dysrhythmias Atrial Fibrillation Rhythm:Regular  One episode of Afib,  WNL rate now   Neuro/Psych negative neurological ROS  negative psych ROS   GI/Hepatic Neg liver ROS, Hx of esophageal spasm   Endo/Other  negative endocrine ROS  Renal/GU negative Renal ROS     Musculoskeletal negative musculoskeletal ROS (+)   Abdominal   Peds negative pediatric ROS (+)  Hematology negative hematology ROS (+)   Anesthesia Other Findings   Reproductive/Obstetrics                             Anesthesia Physical Anesthesia Plan  ASA: II  Anesthesia Plan: General   Post-op Pain Management:    Induction: Intravenous  Airway Management Planned: Nasal Cannula  Additional Equipment:   Intra-op Plan:   Post-operative Plan:   Informed Consent: I have reviewed the patients History and Physical, chart, labs and discussed the procedure including the risks, benefits and alternatives for the proposed anesthesia with the patient or authorized representative who has indicated his/her understanding and acceptance.   Dental advisory given  Plan Discussed with: CRNA and Surgeon  Anesthesia Plan Comments:         Anesthesia Quick Evaluation

## 2015-05-30 NOTE — Op Note (Signed)
[facility] Gastroenterology Patient Name: Perry Richardson Procedure Date: 05/30/2015 9:27 AM MRN: [id number] Account #: [id number] Date of Birth: 1965/12/26 Admit Type: Outpatient Age: 49 Room: [facility] ENDO ROOM 1 Gender: Male Note Status: Finalized Procedure:         Colonoscopy Indications:       Screening for colorectal malignant neoplasm Providers:         Robert Bellow, MD Referring MD:      Versie Starks, MD (Referring MD) Medicines:         Monitored Anesthesia Care Complications:     No immediate complications. Procedure:         Pre-Anesthesia Assessment:                    - Prior to the procedure, a History and Physical was                     performed, and patient medications, allergies and                     sensitivities were reviewed. The patient's tolerance of                     previous anesthesia was reviewed.                    - The risks and benefits of the procedure and the sedation                     options and risks were discussed with the patient. All                     questions were answered and informed consent was obtained.                    After obtaining informed consent, the colonoscope was                     passed under direct vision. Throughout the procedure, the                     patient's blood pressure, pulse, and oxygen saturations                     were monitored continuously. The Colonoscope was                     introduced through the anus and advanced to the the cecum,                     identified by appendiceal orifice and ileocecal valve. The                     colonoscopy was performed without difficulty. The patient                     tolerated the procedure well. The quality of the bowel                     preparation was excellent. Findings:      A 8 mm polyp was found in the ascending colon. The polyp was       pedunculated. The polyp was removed with a hot snare. Resection and   retrieval were complete.  Three sessile polyps were found in the distal transverse colon. The       polyps were 5 to 10 mm in size. These polyps were removed with a hot       snare. Resection and retrieval were complete.      The retroflexed view of the distal rectum and anal verge was normal and       showed no anal or rectal abnormalities. Impression:        - One 8 mm polyp in the ascending colon. Resected and                     retrieved.                    - Three 5 to 10 mm polyps in the distal transverse colon.                     Resected and retrieved.                    - The distal rectum and anal verge are normal on                     retroflexion view. Recommendation:    - Telephone endoscopist for pathology results in 1 week. Robert Bellow, MD 05/30/2015 10:32:54 AM This report has been signed electronically. Number of Addenda: 0 Note Initiated On: 05/30/2015 9:27 AM Scope Withdrawal Time: 0 hours 11 minutes 41 seconds  Total Procedure Duration: 0 hours 26 minutes 30 seconds       [facility]

## 2015-05-31 LAB — SURGICAL PATHOLOGY

## 2015-06-01 ENCOUNTER — Telehealth: Payer: Self-pay

## 2015-06-01 ENCOUNTER — Encounter: Payer: Self-pay | Admitting: General Surgery

## 2015-06-01 NOTE — Telephone Encounter (Signed)
-----   Message from Robert Bellow, MD sent at 06/01/2015  8:46 AM EDT ----- Please notify the patient the colon biopsies showed polyps that should be re-assessed in five years. Please put in recalls. The biopsied of the stomach and esophagus were fine.   He should let me know if he continue to have episodes of spasm, and that he should try the Levsin again if it recurs. Thanks.  ----- Message -----    From: [facility]    Sent: 05/31/2015   6:26 PM      To: Robert Bellow, MD

## 2015-06-01 NOTE — Telephone Encounter (Signed)
Notified patient as instructed, patient pleased. Discussed follow-up in 5 years, patient agrees. Patient placed in recalls for repeat colonoscopy in 5 years.

## 2015-06-27 ENCOUNTER — Encounter: Payer: Self-pay | Admitting: General Surgery

## 2015-08-18 ENCOUNTER — Emergency Department
Admission: EM | Admit: 2015-08-18 | Discharge: 2015-08-18 | Disposition: A | Discharge status: Home / Self Care | Payer: BLUE CROSS/BLUE SHIELD | Attending: Emergency Medicine | Admitting: Emergency Medicine

## 2015-08-18 ENCOUNTER — Encounter: Payer: Self-pay | Admitting: Emergency Medicine

## 2015-08-18 ENCOUNTER — Telehealth: Payer: Self-pay | Admitting: Physician Assistant

## 2015-08-18 ENCOUNTER — Emergency Department: Payer: BLUE CROSS/BLUE SHIELD

## 2015-08-18 DIAGNOSIS — R0989 Other specified symptoms and signs involving the circulatory and respiratory systems: Secondary | ICD-10-CM | POA: Insufficient documentation

## 2015-08-18 DIAGNOSIS — Z79899 Other long term (current) drug therapy: Secondary | ICD-10-CM | POA: Diagnosis not present

## 2015-08-18 DIAGNOSIS — Z7982 Long term (current) use of aspirin: Secondary | ICD-10-CM | POA: Diagnosis not present

## 2015-08-18 DIAGNOSIS — I493 Ventricular premature depolarization: Secondary | ICD-10-CM | POA: Diagnosis not present

## 2015-08-18 DIAGNOSIS — Z7951 Long term (current) use of inhaled steroids: Secondary | ICD-10-CM | POA: Insufficient documentation

## 2015-08-18 DIAGNOSIS — R002 Palpitations: Secondary | ICD-10-CM | POA: Diagnosis present

## 2015-08-18 LAB — BASIC METABOLIC PANEL
ANION GAP: 8 (ref 5–15)
BUN: 17 mg/dL (ref 6–20)
CHLORIDE: 99 mmol/L — AB (ref 101–111)
CO2: 29 mmol/L (ref 22–32)
CREATININE: 1.23 mg/dL (ref 0.61–1.24)
Calcium: 9 mg/dL (ref 8.9–10.3)
GFR calc non Af Amer: >60 mL/min (ref >60)
Glucose, Bld: 121 mg/dL — ABNORMAL HIGH (ref 65–99)
POTASSIUM: 3.5 mmol/L (ref 3.5–5.1)
SODIUM: 136 mmol/L (ref 135–145)

## 2015-08-18 LAB — CBC
HEMATOCRIT: 46.9 % (ref 40.0–52.0)
HEMOGLOBIN: 16.2 g/dL (ref 13.0–18.0)
MCH: 31.4 pg (ref 26.0–34.0)
MCHC: 34.4 g/dL (ref 32.0–36.0)
MCV: 91.1 fL (ref 80.0–100.0)
PLATELETS: 212 10*3/uL (ref 150–440)
RBC: 5.15 MIL/uL (ref 4.40–5.90)
RDW: 12.8 % (ref 11.5–14.5)
WBC: 8 10*3/uL (ref 3.8–10.6)

## 2015-08-18 LAB — TROPONIN I

## 2015-08-18 MED ORDER — METOPROLOL TARTRATE 25 MG PO TABS: 25.0000 mg | ORAL_TABLET | Freq: Two times a day (BID) | ORAL | Status: DC

## 2015-08-18 NOTE — ED Provider Notes (Signed)
[facility] Emergency Department Provider Note  ____________________________________________  Time seen: 9:30 PM  I have reviewed the triage vital signs and the nursing notes.   HISTORY  Chief Complaint Palpitations    HPI Perry Richardson is a 49 y.o. male who presents with complaints of skipped beats which she has noticed over the last 2 days. 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 The most she has noticed this 3 skin beats in 1 minute. He denies chest pain. No shortness of breath. No recent travel. He has had some mild cold symptoms. He has seen Dr. Rockey Situ the past for atrial fibrillation which was paroxysmal and resolved on its own. He is not on any medications for atrial fibrillation     Past Medical History  Diagnosis Date  . Atrial fibrillation [facility]) Oct 2014    one episode  . Hyperlipidemia   . Sleep apnea     CPAP    Patient Active Problem List   Diagnosis Date Noted  . Family history of colonic polyps 04/05/2015  . Esophageal spasm 04/05/2015  . Atrial fibrillation ([place]) 07/25/2011  . OSA (obstructive sleep apnea) 07/25/2011  . Hyperlipidemia 07/25/2011    Past Surgical History  Procedure Laterality Date  . Nasal sinus surgery      x2  . Tonsillectomy    . Mandible fracture surgery    . Colonoscopy with propofol N/A 05/30/2015    Procedure: COLONOSCOPY WITH PROPOFOL;  Surgeon: Trudee Chirino Bellow, MD;  Location: [facility] ENDOSCOPY;  Service: Endoscopy;  Laterality: N/A;  . Esophagogastroduodenoscopy N/A 05/30/2015    Procedure: ESOPHAGOGASTRODUODENOSCOPY (EGD);  Surgeon: Gabbriella Presswood Bellow, MD;  Location: [facility] ENDOSCOPY;  Service: Endoscopy;  Laterality: N/A;    Current Outpatient Rx  Name  Route  Sig  Dispense  Refill  . aspirin 81 MG tablet   Oral   Take 81 mg by mouth daily.         [name] 30 MG/ACT SOLN      as directed.         . Cholecalciferol (VITAMIN D) 400 UNITS capsule   Oral   Take  400 Units by mouth daily.          . Cyanocobalamin (RA VITAMIN B-12 TR) 1000 MCG TBCR   Oral   Take 1,000 mcg by mouth daily.          . finasteride (PROSCAR) 5 MG tablet   Oral   Take 5 mg by mouth daily.          . fluticasone (FLONASE) 50 MCG/ACT nasal spray   Nasal   Place 2 sprays into the nose daily.           . hyoscyamine (LEVSIN/SL) 0.125 MG SL tablet   Sublingual   Place 1 tablet (0.125 mg total) under the tongue every 4 (four) hours as needed for cramping.   30 tablet   0   . NON FORMULARY      CPAP machine         . polyethylene glycol powder (GLYCOLAX/MIRALAX) powder      255 grams one bottle for colonoscopy prep   255 g   0   . simvastatin (ZOCOR) 10 MG tablet   Oral   Take 1 tablet by mouth Daily.           Allergies Review of patient's allergies indicates no known allergies.  Family History  Problem Relation Age of Onset  .  Hypertension Father   . COPD Mother   . Cancer Mother     kidney  . Cancer Other 25    niece/rectal cancer  . Colon polyps Daughter 25    colon polyp    Social History Social History  Substance Use Topics  . Smoking status: Never Smoker   . Smokeless tobacco: Never Used  . Alcohol Use: 0.0 oz/week    0 Standard drinks or equivalent per week     Comment: rare, 2/week    Review of Systems  Constitutional: Negative for fever. Eyes: Negative for visual changes. ENT: Negative for sore throat Cardiovascular: Negative for chest pain. Positive for skipped beats Respiratory: Negative for shortness of breath. Gastrointestinal: Negative for abdominal pain, vomiting and diarrhea. Genitourinary: Negative for dysuria. Musculoskeletal: Negative for back pain. Skin: Negative for rash. Neurological: Negative for headaches or focal weakness Psychiatric: No anxiety    ____________________________________________   PHYSICAL EXAM:  VITAL SIGNS: ED Triage Vitals  Enc Vitals Group     BP 08/18/15 1929  135/78 mmHg     Pulse Rate 08/18/15 1929 80     Resp 08/18/15 1927 16     Temp 08/18/15 1929 98 F (36.7 C)     Temp Source 08/18/15 1929 Oral     SpO2 08/18/15 1929 100 %     Weight 08/18/15 1927 215 lb (97.523 kg)     Height 08/18/15 1927 5\' 10"  (1.778 m)     Head Cir --      Peak Flow --      Pain Score --      Pain Loc --      Pain Edu? --      Excl. in [place]? --      Constitutional: Alert and oriented. Well appearing and in no distress. Eyes: Conjunctivae are normal.  ENT   Head: Normocephalic and atraumatic.   Mouth/Throat: Mucous membranes are moist. Cardiovascular: Normal rate, regular rhythm. Normal and symmetric distal pulses are present in all extremities. No murmurs, rubs, or gallops. Respiratory: Normal respiratory effort without tachypnea nor retractions. Breath sounds are clear and equal bilaterally.  Gastrointestinal: Soft and non-tender in all quadrants. No distention. There is no CVA tenderness. Genitourinary: deferred Musculoskeletal: Nontender with normal range of motion in all extremities. No lower extremity tenderness nor edema. Neurologic:  Normal speech and language. No gross focal neurologic deficits are appreciated. Skin:  Skin is warm, dry and intact. No rash noted. Psychiatric: Mood and affect are normal. Patient exhibits appropriate insight and judgment.  ____________________________________________    LABS (pertinent positives/negatives)  Labs Reviewed  BASIC METABOLIC PANEL - Abnormal; Notable for the following:    Chloride 99 (*)    Glucose, Bld 121 (*)    All other components within normal limits  CBC  TROPONIN I    ____________________________________________   EKG  ED ECG REPORT I, Lavonia Drafts, the attending physician, personally viewed and interpreted this ECG.  Date: 08/18/2015 EKG Time: 7:29 PM Rate: 77 Rhythm: normal sinus rhythm QRS Axis: normal Intervals: normal ST/T Wave abnormalities: normal Conduction  Disutrbances: none Narrative Interpretation: unremarkable   ____________________________________________    RADIOLOGY I have personally reviewed any xrays that were ordered on this patient: Normal chest x-ray  ____________________________________________   PROCEDURES  Procedure(s) performed: none  Critical Care performed: none  ____________________________________________   INITIAL IMPRESSION / ASSESSMENT AND PLAN / ED COURSE  Pertinent labs & imaging results that were available during my care of the patient were reviewed  by me and considered in my medical decision making (see chart for details).  Patient well-appearing and in no distress. He has no chest pain. We have seen intermittent PVCs on monitor but never any in a row. I discussed with Dr. Burt Knack of cardiology who recommends metoprolol twice a day when necessary and follow-up with Dr. Rockey Situ of cardiology. Discussed this with the patient he agrees plan  ____________________________________________   FINAL CLINICAL IMPRESSION(S) / ED DIAGNOSES  Final diagnoses:  PVC's (premature ventricular contractions)     Lavonia Drafts, MD 08/18/15 [phone]

## 2015-08-18 NOTE — Discharge Instructions (Signed)
Premature Ventricular Contraction A premature ventricular contraction is an irregularity in the normal heart rhythm. These contractions are extra heartbeats that occur too early in the normal sequence. In most cases, these contractions are harmless and do not require treatment. CAUSES Premature ventricular contractions may occur without a known cause. In healthy people, the extra contractions may be caused by:  Smoking.  Drinking alcohol.  Caffeine.  Certain medicines.  Some illegal drugs.  Stress. Sometimes, changes in chemicals in the blood (electrolytes) can also cause premature ventricular contractions. They can also occur in people with heart diseases that cause a decrease in blood flow to the heart. SIGNS AND SYMPTOMS Premature ventricular contractions often do not cause any symptoms. In some cases, you may have a feeling of your heart beating fast or skipping a beat (palpitations). DIAGNOSIS Your health care provider will take your medical history and do a physical exam. During the exam, the health care provider will check for irregular heartbeats. Various tests may be done to help diagnose premature ventricular contractions. These tests may include:  An ECG (electrocardiogram) to monitor the electrical activity of your heart.  Holter monitor testing. A Holter monitor is a portable device that can monitor the electrical activity of your heart over longer periods of time.  Stress tests to see how exercise affects your heart rhythm.  Echocardiogram. This test uses sound waves (ultrasound) to produce an image of your heart.  Electrophysiology study. This is used to evaluate the electrical conduction system of your heart. TREATMENT Usually, no treatment is needed. You may be advised to avoid things that can trigger the premature contractions, such as caffeine or alcohol. Medicines are sometimes given if symptoms are severe or if the extra heartbeats are very frequent. Treatment may  also be needed for an underlying cause of the contractions if one is found. HOME CARE INSTRUCTIONS  Take medicines only as directed by your health care provider.  Make any lifestyle changes recommended by your health care provider. These may include:  Quitting smoking.  Avoiding or limiting caffeine or alcohol.  Exercising. Talk to your health care provider about what type of exercise is safe for you.  Trying to reduce stress.  Keep all follow-up visits with your health care provider. This is important. SEEK IMMEDIATE MEDICAL CARE IF:  You feel palpitations that are frequent or continual.  You have chest pain.  You have shortness of breath.  You have sweating for no reason.  You have nausea and vomiting.  You become light-headed or faint.   This information is not intended to replace advice given to you by your health care provider. Make sure you discuss any questions you have with your health care provider.   Document Released: 05/16/2004 Document Revised: 10/20/2014 Document Reviewed: 03/02/2014 Elsevier Interactive Patient Education 2016 Else[employer] Inc[employer]  

## 2015-08-18 NOTE — ED Notes (Signed)
Pt states has a h istory of atrial fibrillation. Pt states for two days has felt like he "is skipping beats." pt denies dizziness, pain, shob, nausea, diaphoresis. Pt with pwd skin.

## 2015-08-18 NOTE — Telephone Encounter (Signed)
The patient with a hx of afib who converted to NSR on IV diltiazem (2012). On Po cardizem for about few months afterwards. Last seen by Dr. Rockey Situ 09/13/14. Since then he was doing well on cardiac stand point of view up until yesterday when he started to having intermittent "palpiations/skipping beat". Same feeling as previous afib episode. He works at [employer]. 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 Per patient, "did not showed afib but few extra/irregular beat". His rate in 60s. He has chest congestion and cough for the past 3 days. The patient denies nausea, vomiting, fever, chest pain, shortness of breath, orthopnea, PND, dizziness, syncope, cough, congestion, abdominal pain, hematochezia, melena, lower extremity edema.  His palpitation is concerning for afib vs arrhthymias. He is going to field trip tomorrow.  I have advised him to got ER for further eval. He did not want to go to ER. I have advised him to go Urgent care for further evaluation. He might need event monitor to r/o arrhthymias if does not qualifies for hospital admission.   Renella Steig, [place]

## 2015-08-21 ENCOUNTER — Telehealth: Payer: Self-pay | Admitting: *Deleted

## 2015-08-21 DIAGNOSIS — I493 Ventricular premature depolarization: Secondary | ICD-10-CM

## 2015-08-21 NOTE — Telephone Encounter (Signed)
Pt called back and I scheduled him to come see [place] on Nov 18th to see Dr Rockey Situ  But he wants to talk to the nurse about his symptoms.  He stated he wanted to go over his symptoms.  Stated to him that this is the reason we made him an apt so we can go over them.  He said he can't wait 2w to talk about this. He was very adamant about this. Please call patient he is concerned about the pvc's, stating this is new on set and wants to know what is normal and what is not.

## 2015-08-21 NOTE — Telephone Encounter (Signed)
pvcs likely exacerbated by URI Would continue metoprolol This should improve after URI resolves 48 holter monitor ok if he would like

## 2015-08-21 NOTE — Telephone Encounter (Signed)
ED doc sent Dr. Rockey Situ a note on pt's visit and med change.  He just needs a f/u at his convenience.

## 2015-08-21 NOTE — Telephone Encounter (Signed)
lmov to schedule FU

## 2015-08-21 NOTE — Telephone Encounter (Signed)
Perhaps he can be seen in clinic this week for one year follow up

## 2015-08-21 NOTE — Telephone Encounter (Signed)
Left message for pt to call back  °

## 2015-08-21 NOTE — Telephone Encounter (Signed)
Pt was seen in ED and was seen for PVC's and he is calling to let us know he was put on Metoprolol a low dose.  Symptoms are still there.  He is at the beach and not sure if he needs to come back from that.  Please advise

## 2015-08-21 NOTE — Telephone Encounter (Signed)
Spoke w/ pt.  He reports that he is out of town until next weekend.  Went to ED on 08/18/15 and was d/c'd on metoprolol tartrate 25 mg BID.  Reports that PVCs have increased in frequency, up to several times a day. Pt has only taken 1 dose (last night) so far and has not noticed a difference yet.  Advised him to take these as directed.  Reports wheezing w/ his PVCs, feels that he is congested; denies SOB.  Dr. Rockey Situ had discussed possible holter monitor at ov in 2014, as well as meds, but pt declined both. He is interested in wearing a monitor now, is agreeable to wearing before appt on 11/18 if Dr. Rockey Situ can have results available during ov. Pt does not smoke, but does have occasional caffeine & alcohol.  Advised him to avoid caffeine, alcohol & stress.  He is currently on vacation and does admit that he will have difficulty avoiding alcohol.  He will be back in town on Sunday and can come over on Monday if he can have a monitor placed.

## 2015-08-22 NOTE — Telephone Encounter (Signed)
Can we schedule him to come in next week for a 48 hr holter? He said that if he doesn't answer, to leave a message w/ date & time.  He would prefer to come over on Monday if possible.

## 2015-08-22 NOTE — Telephone Encounter (Signed)
Lmov to for Monday Nov 14 at 11am

## 2015-08-27 ENCOUNTER — Ambulatory Visit (INDEPENDENT_AMBULATORY_CARE_PROVIDER_SITE_OTHER): Payer: BLUE CROSS/BLUE SHIELD

## 2015-08-27 DIAGNOSIS — I493 Ventricular premature depolarization: Secondary | ICD-10-CM

## 2015-08-31 ENCOUNTER — Ambulatory Visit (INDEPENDENT_AMBULATORY_CARE_PROVIDER_SITE_OTHER): Payer: BLUE CROSS/BLUE SHIELD | Admitting: Cardiovascular Disease

## 2015-08-31 ENCOUNTER — Encounter: Payer: Self-pay | Admitting: Cardiovascular Disease

## 2015-08-31 VITALS — BP 89/68 | HR 51 | Ht 70.5 in | Wt 227.2 lb

## 2015-08-31 DIAGNOSIS — E785 Hyperlipidemia, unspecified: Secondary | ICD-10-CM

## 2015-08-31 DIAGNOSIS — I493 Ventricular premature depolarization: Secondary | ICD-10-CM | POA: Insufficient documentation

## 2015-08-31 DIAGNOSIS — R0789 Other chest pain: Secondary | ICD-10-CM | POA: Insufficient documentation

## 2015-08-31 DIAGNOSIS — I4891 Unspecified atrial fibrillation: Secondary | ICD-10-CM

## 2015-08-31 DIAGNOSIS — G4733 Obstructive sleep apnea (adult) (pediatric): Secondary | ICD-10-CM

## 2015-08-31 DIAGNOSIS — Z8249 Family history of ischemic heart disease and other diseases of the circulatory system: Secondary | ICD-10-CM | POA: Diagnosis not present

## 2015-08-31 MED ORDER — ALBUTEROL SULFATE HFA 108 (90 BASE) MCG/ACT IN AERS: 2.0000 | INHALATION_SPRAY | Freq: Four times a day (QID) | RESPIRATORY_TRACT | Status: DC | PRN

## 2015-08-31 NOTE — Assessment & Plan Note (Signed)
Episode of chest pain June 2016 Otherwise very active with no reproducible chest pain Low risk of coronary artery disease CT coronary calcium score has been ordered. He is concerned about underlying CAD

## 2015-08-31 NOTE — Assessment & Plan Note (Signed)
CT coronary calcium score pending. This can help guide cholesterol management

## 2015-08-31 NOTE — Patient Instructions (Addendum)
Medication Instructions:  Please stop metoprolol Please start Bystolic 5 mg once daily for palpitations  Labwork: None  Testing/Procedures: You are scheduled for a CT calcium score  Wednesday, Nov 23 @ 10:30am Please arrive @ 10:15 There is a one-time fee of $150.00 due at the time of your procedure  Follow-Up: 6 months  If you need a refill on your cardiac medications before your next appointment, please call your pharmacy.  Coronary Calcium Scan A coronary calcium scan is an imaging test used to look for deposits of calcium and other fatty materials (plaques) in the inner lining of the blood vessels of your heart (coronary arteries). These deposits of calcium and plaques can partly clog and narrow the coronary arteries without producing any symptoms or warning signs. This puts you at risk for a heart attack. This test can detect these deposits before symptoms develop.  LET [facility] CARE PROVIDER KNOW ABOUT:  Any allergies you have.  All medicines you are taking, including vitamins, herbs, eye drops, creams, and over-the-counter medicines.  Previous problems you or members of your family have had with the use of anesthetics.  Any blood disorders you have.  Previous surgeries you have had.  Medical conditions you have.  Possibility of pregnancy, if this applies. RISKS AND COMPLICATIONS Generally, this is a safe procedure. However, as with any procedure, complications can occur. This test involves the use of radiation. Radiation exposure can be dangerous to a pregnant woman and her unborn baby. If you are pregnant, you should not have this procedure done.  BEFORE THE PROCEDURE There is no special preparation for the procedure. PROCEDURE  You will need to undress and put on a hospital gown. You will need to remove any jewelry around your neck or chest.  Sticky electrodes are placed on your chest and are connected to an electrocardiogram (EKG or electrocardiography) machine  to recorda tracing of the electrical activity of your heart.  A CT scanner will take pictures of your heart. During this time, you will be asked to lie still and hold your breath for 2-3 seconds while a picture is being taken of your heart. AFTER THE PROCEDURE   You will be allowed to get dressed.  You can return to your normal activities after the scan is done.   This information is not intended to replace advice given to you by your health care provider. Make sure you discuss any questions you have with your health care provider.   Document Released: 03/27/2008 Document Revised: 10/04/2013 Document Reviewed: 06/06/2013 Elsevier Interactive Patient Education [employer].

## 2015-08-31 NOTE — Assessment & Plan Note (Signed)
Rare ectopy seen on Holter monitor He reports that the short runs of narrow complex tachycardia and PVCs are causing significant symptoms Metoprolol tartrate is not helping the palpitations We will try bystolic 5 mg daily with slow titration upwards as tolerated, hold the metoprolol

## 2015-08-31 NOTE — Assessment & Plan Note (Signed)
No recent symptoms concerning for atrial fibrillation Holter monitor showing very short runs of narrow complex tachycardia consistent with atrial tachycardia Rare PVC, APC

## 2015-08-31 NOTE — Progress Notes (Signed)
Patient ID: Perry Richardson, male    DOB: May 14, 1966, 49 y.o.   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 Cardiology was consult in for atrial fibrillation. Suspected history of obstructive sleep apnea. 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   EKG on today's visit shows sinus bradycardia with rate 51 bpm, no significant ST or T-wave changes  Other past medical history Total cholesterol improved from 190 down to 184. He has 3 children, stays active He is tolerating low-dose simvastatin  typically heart rate runs low, he is asymptomatic  During his hospital course, it was felt that he had been in atrial fibrillation for at least 24 hours. He had felt some palpitations, shortness of breath with exertion the day prior. No prior history of tachyarrhythmias. He was started on diltiazem infusion and changed to p.o. Cardizem in the morning. he converted to sinus rhythm shortly after. On admission, in the emergency room, he had had a vagal response to a blood draw. Previous Echocardiogram in the hospital was essentially normal with normal LV systolic function   No Known  Allergies  Current Outpatient Prescriptions on File Prior to Visit  Medication Sig Dispense Refill  . aspirin 81 MG tablet Take 81 mg by mouth daily.    [name] 30 MG/ACT SOLN as directed.    . Cholecalciferol (VITAMIN D) 400 UNITS capsule Take 400 Units by mouth daily.     . Cyanocobalamin (RA VITAMIN B-12 TR) 1000 MCG TBCR Take 1,000 mcg by mouth daily.     . finasteride (PROSCAR) 5 MG tablet Take 5 mg by mouth daily.     . fluticasone (FLONASE) 50 MCG/ACT nasal spray Place 2 sprays into the nose daily.      . NON FORMULARY CPAP machine    . simvastatin (ZOCOR) 10 MG tablet Take 1 tablet by mouth Daily.     No current facility-administered medications on file prior to visit.    Past Medical History  Diagnosis Date  . Atrial fibrillation [facility]) Oct 2014    one episode  . Hyperlipidemia   . Sleep apnea     CPAP    Past Surgical History  Procedure Laterality Date  . Nasal sinus surgery      x2  . Tonsillectomy    . Mandible fracture surgery    . Colonoscopy with propofol N/A 05/30/2015    Procedure: COLONOSCOPY WITH PROPOFOL;  Surgeon: Robert Bellow, MD;  Location: [facility] ENDOSCOPY;  Service: Endoscopy;  Laterality: N/A;  . Esophagogastroduodenoscopy N/A 05/30/2015    Procedure: ESOPHAGOGASTRODUODENOSCOPY (EGD);  Surgeon: Robert Bellow, MD;  Location: [facility] ENDOSCOPY;  Service: Endoscopy;  Laterality: N/A;  Social History  reports that he has never smoked. He has never used smokeless tobacco. He reports that he drinks alcohol. He reports that he does not use illicit drugs.  Family History family history includes COPD in his mother; Cancer in his mother; Cancer (age of onset: 84) in his other; Colon polyps (age of onset: 52) in his daughter; Hypertension in his father.   Review of Systems  Constitutional: Negative.   Respiratory: Negative.   Cardiovascular: Positive for palpitations.  Gastrointestinal: Negative.   Musculoskeletal: Negative.   Neurological:  Negative.   Psychiatric/Behavioral: Negative.   All other systems reviewed and are negative.    BP 89/68 mmHg  Pulse 51  Ht 5' 10.5" (1.791 m)  Wt 227 lb 4 oz (103.08 kg)  BMI 32.14 kg/m2  Physical Exam  Constitutional: He is oriented to person, place, and time. He appears well-developed and well-nourished.  HENT:  Head: Normocephalic.  Nose: Nose normal.  Mouth/Throat: Oropharynx is clear and moist.  Eyes: Conjunctivae are normal. Pupils are equal, round, and reactive to light.  Neck: Normal range of motion. Neck supple. No JVD present.  Cardiovascular: Normal rate, regular rhythm, S1 normal, S2 normal, normal heart sounds and intact distal pulses.  Exam reveals no gallop and no friction rub.   No murmur heard. Pulmonary/Chest: Effort normal and breath sounds normal. No respiratory distress. He has no wheezes. He has no rales. He exhibits no tenderness.  Abdominal: Soft. Bowel sounds are normal. He exhibits no distension. There is no tenderness.  Musculoskeletal: Normal range of motion. He exhibits no edema or tenderness.  Lymphadenopathy:    He has no cervical adenopathy.  Neurological: He is alert and oriented to person, place, and time. Coordination normal.  Skin: Skin is warm and dry. No rash noted. No erythema.  Psychiatric: He has a normal mood and affect. His behavior is normal. Judgment and thought content normal.      Assessment and Plan   Nursing note and vitals reviewed.

## 2015-09-05 ENCOUNTER — Inpatient Hospital Stay: Admission: RE | Admit: 2015-09-05 | Payer: BLUE CROSS/BLUE SHIELD | Source: Ambulatory Visit

## 2015-09-11 ENCOUNTER — Ambulatory Visit (INDEPENDENT_AMBULATORY_CARE_PROVIDER_SITE_OTHER)
Admission: RE | Admit: 2015-09-11 | Discharge: 2015-09-11 | Disposition: A | Discharge status: Home / Self Care | Payer: BLUE CROSS/BLUE SHIELD | Source: Ambulatory Visit | Attending: Cardiovascular Disease | Admitting: Cardiovascular Disease

## 2015-09-11 DIAGNOSIS — Z8249 Family history of ischemic heart disease and other diseases of the circulatory system: Secondary | ICD-10-CM

## 2015-09-11 DIAGNOSIS — I4891 Unspecified atrial fibrillation: Secondary | ICD-10-CM

## 2015-09-13 ENCOUNTER — Emergency Department
Admission: EM | Admit: 2015-09-13 | Discharge: 2015-09-14 | Disposition: A | Discharge status: Home / Self Care | Payer: BLUE CROSS/BLUE SHIELD | Attending: Emergency Medicine | Admitting: Emergency Medicine

## 2015-09-13 DIAGNOSIS — Z7982 Long term (current) use of aspirin: Secondary | ICD-10-CM | POA: Insufficient documentation

## 2015-09-13 DIAGNOSIS — Z79899 Other long term (current) drug therapy: Secondary | ICD-10-CM | POA: Diagnosis not present

## 2015-09-13 DIAGNOSIS — I480 Paroxysmal atrial fibrillation: Secondary | ICD-10-CM | POA: Insufficient documentation

## 2015-09-13 DIAGNOSIS — Z7951 Long term (current) use of inhaled steroids: Secondary | ICD-10-CM | POA: Diagnosis not present

## 2015-09-13 DIAGNOSIS — R002 Palpitations: Secondary | ICD-10-CM | POA: Diagnosis present

## 2015-09-13 NOTE — ED Notes (Signed)
Pt in with co palpitations tonight, hx of afib.

## 2015-09-14 ENCOUNTER — Telehealth: Payer: Self-pay | Admitting: Cardiovascular Disease

## 2015-09-14 ENCOUNTER — Ambulatory Visit (INDEPENDENT_AMBULATORY_CARE_PROVIDER_SITE_OTHER): Payer: BLUE CROSS/BLUE SHIELD | Admitting: Cardiovascular Disease

## 2015-09-14 ENCOUNTER — Encounter: Payer: Self-pay | Admitting: Cardiovascular Disease

## 2015-09-14 VITALS — BP 100/76 | HR 54 | Ht 70.0 in | Wt 228.0 lb

## 2015-09-14 DIAGNOSIS — I4891 Unspecified atrial fibrillation: Secondary | ICD-10-CM | POA: Diagnosis not present

## 2015-09-14 DIAGNOSIS — E785 Hyperlipidemia, unspecified: Secondary | ICD-10-CM | POA: Diagnosis not present

## 2015-09-14 DIAGNOSIS — I493 Ventricular premature depolarization: Secondary | ICD-10-CM

## 2015-09-14 DIAGNOSIS — R0789 Other chest pain: Secondary | ICD-10-CM

## 2015-09-14 DIAGNOSIS — G4733 Obstructive sleep apnea (adult) (pediatric): Secondary | ICD-10-CM

## 2015-09-14 LAB — CBC
HCT: 49.7 % (ref 40.0–52.0)
Hemoglobin: 16.4 g/dL (ref 13.0–18.0)
MCH: 30.2 pg (ref 26.0–34.0)
MCHC: 33 g/dL (ref 32.0–36.0)
MCV: 91.6 fL (ref 80.0–100.0)
PLATELETS: 204 10*3/uL (ref 150–440)
RBC: 5.43 MIL/uL (ref 4.40–5.90)
RDW: 13.5 % (ref 11.5–14.5)
WBC: 7.3 10*3/uL (ref 3.8–10.6)

## 2015-09-14 LAB — COMPREHENSIVE METABOLIC PANEL
ALK PHOS: 50 U/L (ref 38–126)
ALT: 30 U/L (ref 17–63)
ANION GAP: 5 (ref 5–15)
AST: 23 U/L (ref 15–41)
Albumin: 4.3 g/dL (ref 3.5–5.0)
BUN: 22 mg/dL — ABNORMAL HIGH (ref 6–20)
CALCIUM: 9.4 mg/dL (ref 8.9–10.3)
CHLORIDE: 107 mmol/L (ref 101–111)
CO2: 29 mmol/L (ref 22–32)
Creatinine, Ser: 1.11 mg/dL (ref 0.61–1.24)
GFR calc non Af Amer: >60 mL/min (ref >60)
Glucose, Bld: 129 mg/dL — ABNORMAL HIGH (ref 65–99)
Potassium: 3.9 mmol/L (ref 3.5–5.1)
SODIUM: 141 mmol/L (ref 135–145)
Total Bilirubin: 0.5 mg/dL (ref 0.3–1.2)
Total Protein: 7.3 g/dL (ref 6.5–8.1)

## 2015-09-14 LAB — TROPONIN I: Troponin I: <0.03 ng/mL (ref <0.031)

## 2015-09-14 MED ORDER — FLECAINIDE ACETATE 50 MG PO TABS: 50.0000 mg | ORAL_TABLET | Freq: Two times a day (BID) | ORAL | Status: DC | PRN

## 2015-09-14 MED ORDER — DILTIAZEM HCL 30 MG PO TABS: 30.0000 mg | ORAL_TABLET | Freq: Three times a day (TID) | ORAL | Status: DC | PRN

## 2015-09-14 MED ORDER — NEBIVOLOL HCL 5 MG PO TABS: 5.0000 mg | ORAL_TABLET | Freq: Every day | ORAL | Status: DC

## 2015-09-14 NOTE — Assessment & Plan Note (Signed)
CT coronary calcium score reviewed with him. Score of 0. Prior episodes of chest pain atypical in nature, no further workup needed

## 2015-09-14 NOTE — Patient Instructions (Addendum)
You are doing well.  If you go back into atrial fibrillation, Take a diltiazem one pill (30 mg) And take a flecainide 50 mg pill  Wait 1 to 2 hours, If not back in normal rhythm, Take another flecainide   Over 12 hours, Take an eliquis 5 mg Typically eliquis is take twice a day  Please call us if you have new issues that need to be addressed before your next appt.  Your physician wants you to follow-up in: 6 months.  You will receive a reminder letter in the mail two months in advance. If you don't receive a letter, please call our office to schedule the follow-up appointment.

## 2015-09-14 NOTE — ED Provider Notes (Signed)
[facility] Emergency Department Provider Note  Time seen: 12:50 AM  I have reviewed the triage vital signs and the nursing notes.   HISTORY  Chief Complaint Palpitations    HPI Perry Richardson is a 49 y.o. male with a past medical history of paroxysmal atrial fibrillation, hyperlipidemia, who presents the emergency department palpitations. According to the patient he was jogging when he began feeling very short of breath with palpitations. He states it felt like he was in atrial fibrillation again so he came to the emergency department for confirmation. Denies any chest pain now or at any time.States his long as he is at rest he does not feel any shortness of breath dizziness or lightheadedness.     Past Medical History  Diagnosis Date  . Atrial fibrillation [facility]) Oct 2014    one episode  . Hyperlipidemia   . Sleep apnea     CPAP    Patient Active Problem List   Diagnosis Date Noted  . Atypical chest pain 08/31/2015  . Ventricular ectopy 08/31/2015  . Family history of colonic polyps 04/05/2015  . Esophageal spasm 04/05/2015  . Atrial fibrillation ([place]) 07/25/2011  . OSA (obstructive sleep apnea) 07/25/2011  . Hyperlipidemia 07/25/2011    Past Surgical History  Procedure Laterality Date  . Nasal sinus surgery      x2  . Tonsillectomy    . Mandible fracture surgery    . Colonoscopy with propofol N/A 05/30/2015    Procedure: COLONOSCOPY WITH PROPOFOL;  Surgeon: Robert Bellow, MD;  Location: [facility] ENDOSCOPY;  Service: Endoscopy;  Laterality: N/A;  . Esophagogastroduodenoscopy N/A 05/30/2015    Procedure: ESOPHAGOGASTRODUODENOSCOPY (EGD);  Surgeon: Robert Bellow, MD;  Location: [facility] ENDOSCOPY;  Service: Endoscopy;  Laterality: N/A;    Current Outpatient Rx  Name  Route  Sig  Dispense  Refill  . albuterol (PROVENTIL HFA;VENTOLIN HFA) 108 (90 BASE) MCG/ACT inhaler   Inhalation   Inhale 2 puffs into the lungs every 6 (six) hours as needed  for wheezing or shortness of breath.   1 Inhaler   2   . aspirin 81 MG tablet   Oral   Take 81 mg by mouth daily.         Perry Richardson 30 MG/ACT SOLN      as directed.         . Cholecalciferol (VITAMIN D) 400 UNITS capsule   Oral   Take 400 Units by mouth daily.          . Cyanocobalamin (RA VITAMIN B-12 TR) 1000 MCG TBCR   Oral   Take 1,000 mcg by mouth daily.          . finasteride (PROSCAR) 5 MG tablet   Oral   Take 5 mg by mouth daily.          . fluticasone (FLONASE) 50 MCG/ACT nasal spray   Nasal   Place 2 sprays into the nose daily.           . nebivolol (BYSTOLIC) 5 MG tablet   Oral   Take 1 tablet (5 mg total) by mouth daily.   30 tablet      . NON FORMULARY      CPAP machine         . simvastatin (ZOCOR) 10 MG tablet   Oral   Take 1 tablet by mouth Daily.           Allergies Review of patient's allergies indicates no known allergies.  Family History  Problem Relation Age of Onset  . Hypertension Father   . COPD Mother   . Cancer Mother     kidney  . Cancer Other 25    niece/rectal cancer  . Colon polyps Daughter 64    colon polyp    Social History Social History  Substance Use Topics  . Smoking status: Never Smoker   . Smokeless tobacco: Never Used  . Alcohol Use: 0.0 oz/week    0 Standard drinks or equivalent per week     Comment: rare, 2/week    Review of Systems Constitutional: Negative for fever. Cardiovascular: Negative for chest pain. Positive for palpitations Respiratory: Shortness of breath with significant exertion only. Gastrointestinal: Negative for abdominal pain, vomiting and diarrhea. Musculoskeletal: Negative for back pain. Neurological: Negative for headache 10-point ROS otherwise negative.  ____________________________________________   PHYSICAL EXAM:  VITAL SIGNS: ED Triage Vitals  Enc Vitals Group     BP 09/13/15 2350 127/92 mmHg     Pulse Rate 09/13/15 2350 79     Resp 09/13/15 2350 18      Temp 09/13/15 2350 97.8 F (36.6 C)     Temp Source 09/13/15 2350 Oral     SpO2 09/13/15 2350 99 %     Weight 09/13/15 2349 215 lb (97.523 kg)     Height 09/13/15 2349 5\' 10"  (1.778 m)     Head Cir --      Peak Flow --      Pain Score --      Pain Loc --      Pain Edu? --      Excl. in [place]? --     Constitutional: Alert and oriented. Well appearing and in no distress. Eyes: Normal exam ENT   Head: Normocephalic and atraumatic.   Mouth/Throat: Mucous membranes are moist. Cardiovascular: Normal rate, regular rhythm. No murmur Respiratory: Normal respiratory effort without tachypnea nor retractions. Breath sounds are clear  Gastrointestinal: Soft and nontender. No distention.  Musculoskeletal: Nontender with normal range of motion in all extremities. No lower extremity tenderness or edema. Neurologic:  Normal speech and language. No gross focal neurologic deficits  Skin:  Skin is warm, dry and intact.  Psychiatric: Mood and affect are normal. Speech and behavior are normal.   ____________________________________________    EKG  EKG reviewed and interpreted by myself shows atrial fibrillation at 75 bpm, narrow QRS, normal axis, normal ST segments.  ____________________________________________    INITIAL IMPRESSION / ASSESSMENT AND PLAN / ED COURSE  Pertinent labs & imaging results that were available during my care of the patient were reviewed by me and considered in my medical decision making (see chart for details).  Patient with a history of paroxysmal atrial fibrillation on 81 mg aspirin daily presents the emergency department palpitations. Patient is in atrial fibrillation currently, rate controlled. Patient takes by systolic. Patient's labs are within normal limits including negative troponin. I discussed the patient with Dr. Algernon Huxley, who is on-call for Dr.Gollan, he states his lungs the patient is asymptomatic at rest, with a rate-controlled rhythm, he can be  discharged home with follow-up tomorrow in the office with Dr.Gollan.  I discussed this plan of care with the patient who is agreeable.  ____________________________________________   FINAL CLINICAL IMPRESSION(S) / ED DIAGNOSES  Paroxysmal atrial fibrillation   Harvest Dark, MD 09/14/15 [phone]

## 2015-09-14 NOTE — Discharge Instructions (Signed)
Atrial Fibrillation °Atrial fibrillation is a type of heartbeat that is irregular or fast (rapid). If you have this condition, your heart keeps quivering in a weird (chaotic) way. This condition can make it so your heart cannot pump blood normally. Having this condition gives a person more risk for stroke, heart failure, and other heart problems. There are different types of atrial fibrillation. Talk with your doctor to learn about the type that you have. °HOME CARE °· Take over-the-counter and prescription medicines only as told by your doctor. °· If your doctor prescribed a blood-thinning medicine, take it exactly as told. Taking too much of it can cause bleeding. If you do not take enough of it, you will not have the protection that you need against stroke and other problems. °· Do not use any tobacco products. These include cigarettes, chewing tobacco, and e-cigarettes. If you need help quitting, ask your doctor. °· If you have apnea (obstructive sleep apnea), manage it as told by your doctor. °· Do not drink alcohol. °· Do not drink beverages that have caffeine. These include coffee, soda, and tea. °· Maintain a healthy weight. Do not use diet pills unless your doctor says they are safe for you. Diet pills may make heart problems worse. °· Follow diet instructions as told by your doctor. °· Exercise regularly as told by your doctor. °· Keep all follow-up visits as told by your doctor. This is important. °GET HELP IF: °· You notice a change in the speed, rhythm, or strength of your heartbeat. °· You are taking a blood-thinning medicine and you notice more bruising. °· You get tired more easily when you move or exercise. °GET HELP RIGHT AWAY IF: °· You have pain in your chest or your belly (abdomen). °· You have sweating or weakness. °· You feel sick to your stomach (nauseous). °· You notice blood in your throw up (vomit), poop (stool), or pee (urine). °· You are short of breath. °· You suddenly have swollen feet  and ankles. °· You feel dizzy. °· Your suddenly get weak or numb in your face, arms, or legs, especially if it happens on one side of your body. °· You have trouble talking, trouble understanding, or both. °· Your face or your eyelid droops on one side. °These symptoms may be an emergency. Do not wait to see if the symptoms will go away. Get medical help right away. Call your local emergency services (911 in the [place] Do not drive yourself to the hospital. °  °This information is not intended to replace advice given to you by your health care provider. Make sure you discuss any questions you have with your health care provider. °  °Document Released: 07/08/2008 Document Revised: 06/20/2015 Document Reviewed: 01/24/2015 °Elsevier Interactive Patient Education ©2016 [employer] ° °

## 2015-09-14 NOTE — Telephone Encounter (Signed)
Spoke w/ pt.  Advised him that Dr. Rockey Situ had a cancellation today at 11:00. He will take it, as he would like to be seen before he goes out of town next week.

## 2015-09-14 NOTE — Assessment & Plan Note (Signed)
He does report some significant improvement in his PVCs with bystolic We will continue current regimen

## 2015-09-14 NOTE — Telephone Encounter (Signed)
Patient went to ed last night for EKG.  Now in Afib says ed doc told hom Dr. Rockey Situ would work him in today.

## 2015-09-14 NOTE — Assessment & Plan Note (Signed)
Episode of atrial fibrillation lasting 12 hours Converting to normal sinus rhythm on its own. He is concerned about paroxysmal episodes and would like medications to convert back to normal sinus rhythm if needed. Recommended he take diltiazem 30 mg pill with flecainide 50 mg pill, weight 1-2 hours and if he has not converted back to normal sinus rhythm, take additional flecainide.  If he is been in atrial fibrillation for more than 12 hours, would take eliquis 5 mill grams twice a day

## 2015-09-14 NOTE — Assessment & Plan Note (Signed)
Encouraged him to stay on his simvastatin We have encouraged continued exercise, careful diet management in an effort to lose weight.

## 2015-09-14 NOTE — Progress Notes (Signed)
Patient ID: Perry Richardson, male    DOB: Aug 18, 1966, 49 y.o.   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 Cardiology was consulted in for atrial fibrillation. Suspected history of obstructive sleep apnea. He is not compliant with his CPAP Seen in the emergency room June 2016 with chest discomfort, workup was negative, felt to be GI in nature He presents today for routine follow-up of atrial fibrillation Previous Holter monitor showing rare PVC, APC, short runs of atrial tachycardia Recent chest CT coronary calcium score of 0, very small 2 mm nodules noted Previously was having symptomatic PVCs  In follow-up today, he reports converting to atrial fibrillation yesterday. Was running when he felt himself convert Went to the emergency room, confirmed atrial fibrillation, discharged with follow-up today.  Took a bath this morning, converted back to normal sinus rhythm. Reports that his PVCs have been significantly better on low-dose bystolic, no significant side effects  He is leaving town on Monday flying to [place], concerned about converting back to atrial fibrillation, would like medications to take in an emergency  EKG from the emergency room reviewed, rate in the 70s, atrial fib EKG on today's visit shows normal sinus rhythm with rate 54 bpm, no significant ST or T-wave changes  Other past medical history Total cholesterol improved from 190 down to 184. He has 3 children, stays active He is tolerating low-dose simvastatin  typically heart rate runs low, he is asymptomatic  During his hospital course, it was felt that he had been in atrial fibrillation for at least 24 hours. He had felt some palpitations, shortness of breath with exertion the day prior. No prior history of tachyarrhythmias. He was started on diltiazem infusion and changed to p.o. Cardizem in the morning.  he converted to sinus rhythm shortly after. On admission, in the emergency room, he had had a vagal response to a blood draw. Previous Echocardiogram in the hospital was essentially normal with normal LV systolic function   No Known Allergies  Current Outpatient Prescriptions on File Prior to Visit  Medication Sig Dispense Refill  . albuterol (PROVENTIL HFA;VENTOLIN HFA) 108 (90 BASE) MCG/ACT inhaler Inhale 2 puffs into the lungs every 6 (six) hours as needed for wheezing or shortness of breath. 1 Inhaler 2  . aspirin 81 MG tablet Take 81 mg by mouth daily.    Perry Richardson 30 MG/ACT SOLN as directed.    . Cholecalciferol (VITAMIN D) 400 UNITS capsule Take 400 Units by mouth daily.     . Cyanocobalamin (RA VITAMIN B-12 TR) 1000 MCG TBCR Take 1,000 mcg by mouth daily.     . finasteride (PROSCAR) 5 MG tablet Take 5 mg by mouth daily.     . fluticasone (FLONASE) 50 MCG/ACT nasal spray Place 2 sprays into both nostrils daily.     . NON FORMULARY CPAP machine    . simvastatin (ZOCOR) 10 MG tablet Take 1 tablet by mouth every evening.      No current facility-administered medications on file prior to visit.    Past Medical History  Diagnosis Date  . Atrial fibrillation [facility]) Oct 2014    one episode  . Hyperlipidemia   . Sleep apnea     CPAP    Past Surgical History  Procedure Laterality Date  . Nasal sinus surgery      x2  . Tonsillectomy    .  Mandible fracture surgery    . Colonoscopy with propofol N/A 05/30/2015    Procedure: COLONOSCOPY WITH PROPOFOL;  Surgeon: Robert Bellow, MD;  Location: [facility] ENDOSCOPY;  Service: Endoscopy;  Laterality: N/A;  . Esophagogastroduodenoscopy N/A 05/30/2015    Procedure: ESOPHAGOGASTRODUODENOSCOPY (EGD);  Surgeon: Robert Bellow, MD;  Location: [facility] ENDOSCOPY;  Service: Endoscopy;  Laterality: N/A;    Social History  reports that he has never smoked. He has never used smokeless tobacco. He reports that he drinks alcohol. He reports that he does  not use illicit drugs.  Family History family history includes COPD in his mother; Cancer in his mother; Cancer (age of onset: 41) in his other; Colon polyps (age of onset: 24) in his daughter; Hypertension in his father.   Review of Systems  Constitutional: Negative.   Respiratory: Negative.   Cardiovascular: Positive for palpitations.  Gastrointestinal: Negative.   Musculoskeletal: Negative.   Neurological: Negative.   Psychiatric/Behavioral: Negative.   All other systems reviewed and are negative.    BP 100/76 mmHg  Pulse 54  Ht 5\' 10"  (1.778 m)  Wt 228 lb (103.42 kg)  BMI 32.71 kg/m2  Physical Exam  Constitutional: He is oriented to person, place, and time. He appears well-developed and well-nourished.  HENT:  Head: Normocephalic.  Nose: Nose normal.  Mouth/Throat: Oropharynx is clear and moist.  Eyes: Conjunctivae are normal. Pupils are equal, round, and reactive to light.  Neck: Normal range of motion. Neck supple. No JVD present.  Cardiovascular: Normal rate, regular rhythm, S1 normal, S2 normal, normal heart sounds and intact distal pulses.  Exam reveals no gallop and no friction rub.   No murmur heard. Pulmonary/Chest: Effort normal and breath sounds normal. No respiratory distress. He has no wheezes. He has no rales. He exhibits no tenderness.  Abdominal: Soft. Bowel sounds are normal. He exhibits no distension. There is no tenderness.  Musculoskeletal: Normal range of motion. He exhibits no edema or tenderness.  Lymphadenopathy:    He has no cervical adenopathy.  Neurological: He is alert and oriented to person, place, and time. Coordination normal.  Skin: Skin is warm and dry. No rash noted. No erythema.  Psychiatric: He has a normal mood and affect. His behavior is normal. Judgment and thought content normal.      Assessment and Plan   Nursing note and vitals reviewed.

## 2015-09-14 NOTE — Assessment & Plan Note (Signed)
He prefers not to be on his CPAP. Wife reports significant snoring We did discuss the association between atrial fibrillation and sleep apnea

## 2015-09-14 NOTE — ED Notes (Signed)
Pt discharged with wife.  Discharge teaching done.  Voiced understanding.  No questions or concerns at this time.  Pt in NAD.  Items with pt upon discharge.  No items left in room.

## 2015-09-14 NOTE — Telephone Encounter (Signed)
ED doctor spoke w/ Dr. Aundra Richardson last night and advised pt to be seen in clinic today.  You have no openings, what would you like to do?

## 2016-01-14 ENCOUNTER — Telehealth: Payer: Self-pay | Admitting: Cardiovascular Disease

## 2016-01-14 ENCOUNTER — Encounter: Payer: Self-pay | Admitting: Cardiovascular Disease

## 2016-01-14 NOTE — Telephone Encounter (Signed)
Sent pt MyChart message w/ instructions.

## 2016-01-14 NOTE — Telephone Encounter (Signed)
Pt is calling stating that we placed patient on some medication just in case he goes into Afib He states he lost those instructions.  States he is fine but wanted to know if we can call him back with those instructions

## 2016-07-22 ENCOUNTER — Encounter: Payer: Self-pay | Admitting: Cardiovascular Disease

## 2016-07-22 ENCOUNTER — Ambulatory Visit (INDEPENDENT_AMBULATORY_CARE_PROVIDER_SITE_OTHER): Payer: BLUE CROSS/BLUE SHIELD | Admitting: Cardiovascular Disease

## 2016-07-22 VITALS — BP 120/84 | HR 42 | Ht 70.0 in | Wt 213.2 lb

## 2016-07-22 DIAGNOSIS — I4891 Unspecified atrial fibrillation: Secondary | ICD-10-CM

## 2016-07-22 DIAGNOSIS — E7800 Pure hypercholesterolemia, unspecified: Secondary | ICD-10-CM | POA: Diagnosis not present

## 2016-07-22 DIAGNOSIS — G4733 Obstructive sleep apnea (adult) (pediatric): Secondary | ICD-10-CM

## 2016-07-22 DIAGNOSIS — I493 Ventricular premature depolarization: Secondary | ICD-10-CM | POA: Diagnosis not present

## 2016-07-22 NOTE — Patient Instructions (Signed)

## 2016-07-22 NOTE — Progress Notes (Signed)
Cardiology Office Note  Date:  07/22/2016   ID:  Perry Richardson, DOB 06-13-1966, MRN [id number]  PCP:  Karen Kitchens, MD   Chief Complaint  Patient presents with  . other    6 month follow up. Patient states he is going well.  Meds reviewed with patient verbally.     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  Seen in the emergency room June 2016 with chest discomfort, workup was negative, felt to be GI in nature He presents today for routine follow-up of atrial fibrillation Previous Holter monitor showing rare PVC, APC, short runs of atrial tachycardia Recent chest CT coronary calcium score of 0, very small 2 mm nodules noted Previously was having symptomatic PVCs  In follow-up today, he reports that he is doing well He is not taking flecainide, bystolic or diltiazem last epsiode of atrial fib 09/13/2015 Very active, running, works out at the [employer] Down 20 pounds Wears CPAP, nasal pillow, 60% Not taking pill  He does report having orthostasis at [employer], was drinking some beers, was very hot  Had labs through the [place]  EKG on today's visit shows sinus bradycardia with rate 42 bpm, no significant ST or T-wave changes  Other past medical history reviewed 09/13/2015 went into atrial fibrillation Was running when he felt himself convert Went to the emergency room, confirmed atrial fibrillation Took a bath, converted back to normal sinus rhythm. Reports that his PVCs have been significantly better on low-dose bystolic, no significant side effects  EKG from the emergency room  rate in the 70s, atrial fib   Previous cholesterol 190 down to 184. He has 3 children, stays active He is tolerating low-dose simvastatin  typically heart rate runs low, he is asymptomatic  During his hospital course, it was felt  that he had been in atrial fibrillation for at least 24 hours. He had felt some palpitations, shortness of breath with exertion the day prior. No prior history of tachyarrhythmias. He was started on diltiazem infusion and changed to p.o. Cardizem in the morning. he converted to sinus rhythm shortly after. On admission, in the emergency room, he had had a vagal response to a blood draw. Previous Echocardiogram in the hospital was essentially normal with normal LV systolic function  PMH:   has a past medical history of Atrial fibrillation [facility]) (Oct 2014); Hyperlipidemia; and Sleep apnea.  PSH:    Past Surgical History:  Procedure Laterality Date  . COLONOSCOPY WITH PROPOFOL N/A 05/30/2015   Procedure: COLONOSCOPY WITH PROPOFOL;  Surgeon: Robert Bellow, MD;  Location: [facility] ENDOSCOPY;  Service: Endoscopy;  Laterality: N/A;  . ESOPHAGOGASTRODUODENOSCOPY N/A 05/30/2015   Procedure: ESOPHAGOGASTRODUODENOSCOPY (EGD);  Surgeon: Robert Bellow, MD;  Location: [facility] ENDOSCOPY;  Service: Endoscopy;  Laterality: N/A;  . MANDIBLE FRACTURE SURGERY    . MOLE REMOVAL    . NASAL SINUS SURGERY     x2  . TONSILLECTOMY      Current Outpatient Prescriptions  Medication Sig Dispense Refill  . aspirin 81 MG tablet Take 81 mg by mouth daily.    [name] 30 MG/ACT SOLN as directed.    . Cholecalciferol (VITAMIN D) 400 UNITS capsule Take 400 Units by mouth daily.     . Cyanocobalamin (RA VITAMIN B-12 TR) 1000 MCG TBCR Take 1,000 mcg by mouth daily.     Marland Kitchen  diltiazem (CARDIZEM) 30 MG tablet Take 1 tablet (30 mg total) by mouth 3 (three) times daily as needed. 90 tablet 3  . finasteride (PROSCAR) 5 MG tablet Take 5 mg by mouth daily.     . flecainide (TAMBOCOR) 50 MG tablet Take 1 tablet (50 mg total) by mouth 2 (two) times daily as needed. 60 tablet 3  . fluticasone (FLONASE) 50 MCG/ACT nasal spray Place 2 sprays into both nostrils daily.     . nebivolol (BYSTOLIC) 5 MG tablet Take 1 tablet (5 mg total) by  mouth daily as needed. 30 tablet 6  . NON FORMULARY CPAP machine    . simvastatin (ZOCOR) 10 MG tablet Take 1 tablet by mouth every evening.     Marland Kitchen albuterol (PROVENTIL HFA;VENTOLIN HFA) 108 (90 BASE) MCG/ACT inhaler Inhale 2 puffs into the lungs every 6 (six) hours as needed for wheezing or shortness of breath. (Patient not taking: Reported on 07/22/2016) 1 Inhaler 2   No current facility-administered medications for this visit.      Allergies:   Review of patient's allergies indicates no known allergies.   Social History:  The patient  reports that he has never smoked. He has never used smokeless tobacco. He reports that he drinks alcohol. He reports that he does not use drugs.   Family History:   family history includes COPD in his mother; Cancer in his mother; Cancer (age of onset: 11) in his other; Colon polyps (age of onset: 70) in his daughter; Hypertension in his father.    Review of Systems: Review of Systems  Constitutional: Negative.   Respiratory: Negative.   Cardiovascular: Negative.   Gastrointestinal: Negative.   Musculoskeletal: Negative.   Neurological: Negative.   Psychiatric/Behavioral: Negative.   All other systems reviewed and are negative.    PHYSICAL EXAM: VS:  BP 120/84 (BP Location: Left Arm, Patient Position: Sitting, Cuff Size: Normal)   Pulse (!) 42   Ht 5\' 10"  (1.778 m)   Wt 213 lb 4 oz (96.7 kg)   BMI 30.60 kg/m  , BMI Body mass index is 30.6 kg/m. GEN: Well nourished, well developed, in no acute distress  HEENT: normal  Neck: no JVD, carotid bruits, or masses Cardiac: RRR; no murmurs, rubs, or gallops,no edema  Respiratory:  clear to auscultation bilaterally, normal work of breathing GI: soft, nontender, nondistended, + BS MS: no deformity or atrophy  Skin: warm and dry, no rash Neuro:  Strength and sensation are intact Psych: euthymic mood, full affect    Recent Labs: 09/14/2015: ALT 30; BUN 22; Creatinine, Ser 1.11; Hemoglobin 16.4;  Platelets 204; Potassium 3.9; Sodium 141    Lipid Panel No results found for: CHOL, HDL, LDLCALC, TRIG    Wt Readings from Last 3 Encounters:  07/22/16 213 lb 4 oz (96.7 kg)  09/14/15 228 lb (103.4 kg)  09/13/15 215 lb (97.5 kg)       ASSESSMENT AND PLAN:  Atrial fibrillation, unspecified type ([place]) - Plan: EKG 12-Lead No recent episodes of arrhythmia since December 2016 He has the medications and will take diastolic and flecainide as needed for breakthrough arrhythmia  Pure hypercholesterolemia Recommended he stay on his simvastatin CT coronary calcium score of 0  Ventricular ectopy Denies having any symptoms concerning for ectopy  OSA (obstructive sleep apnea) Reports he is 60% compliant with his nasal CPAP   Total encounter time more than 15 minutes  Greater than 50% was spent in counseling and coordination of care with the patient  Disposition:   F/U  12 months   Orders Placed This Encounter  Procedures  . EKG 12-Lead     Signed, Esmond Plants, M.D., Ph.D. 07/22/2016  [place], [place]

## 2017-01-28 ENCOUNTER — Emergency Department: Payer: Worker's Compensation

## 2017-01-28 ENCOUNTER — Emergency Department
Admission: EM | Admit: 2017-01-28 | Discharge: 2017-01-28 | Disposition: A | Discharge status: Home / Self Care | Payer: Worker's Compensation | Attending: Student in an Organized Health Care Education/Training Program | Admitting: Student in an Organized Health Care Education/Training Program

## 2017-01-28 ENCOUNTER — Encounter: Payer: Self-pay | Admitting: Emergency Medicine

## 2017-01-28 DIAGNOSIS — W208XXA Other cause of strike by thrown, projected or falling object, initial encounter: Secondary | ICD-10-CM | POA: Insufficient documentation

## 2017-01-28 DIAGNOSIS — Z7982 Long term (current) use of aspirin: Secondary | ICD-10-CM | POA: Diagnosis not present

## 2017-01-28 DIAGNOSIS — Y929 Unspecified place or not applicable: Secondary | ICD-10-CM | POA: Diagnosis not present

## 2017-01-28 DIAGNOSIS — S0990XA Unspecified injury of head, initial encounter: Secondary | ICD-10-CM | POA: Diagnosis present

## 2017-01-28 DIAGNOSIS — Y939 Activity, unspecified: Secondary | ICD-10-CM | POA: Diagnosis not present

## 2017-01-28 DIAGNOSIS — S0101XA Laceration without foreign body of scalp, initial encounter: Secondary | ICD-10-CM | POA: Diagnosis not present

## 2017-01-28 DIAGNOSIS — Z23 Encounter for immunization: Secondary | ICD-10-CM | POA: Diagnosis not present

## 2017-01-28 DIAGNOSIS — Z5181 Encounter for therapeutic drug level monitoring: Secondary | ICD-10-CM | POA: Diagnosis not present

## 2017-01-28 DIAGNOSIS — Y999 Unspecified external cause status: Secondary | ICD-10-CM | POA: Diagnosis not present

## 2017-01-28 DIAGNOSIS — Z79899 Other long term (current) drug therapy: Secondary | ICD-10-CM | POA: Insufficient documentation

## 2017-01-28 LAB — BASIC METABOLIC PANEL
Anion gap: 6 (ref 5–15)
BUN: 18 mg/dL (ref 6–20)
CHLORIDE: 107 mmol/L (ref 101–111)
CO2: 26 mmol/L (ref 22–32)
CREATININE: 1.12 mg/dL (ref 0.61–1.24)
Calcium: 8.8 mg/dL — ABNORMAL LOW (ref 8.9–10.3)
Glucose, Bld: 127 mg/dL — ABNORMAL HIGH (ref 65–99)
Potassium: 3.2 mmol/L — ABNORMAL LOW (ref 3.5–5.1)
SODIUM: 139 mmol/L (ref 135–145)

## 2017-01-28 LAB — CBC WITH DIFFERENTIAL/PLATELET
BASOS PCT: 1 %
Basophils Absolute: 0 10*3/uL (ref 0–0.1)
Eosinophils Absolute: 0.3 10*3/uL (ref 0–0.7)
Eosinophils Relative: 4 %
HEMATOCRIT: 41.4 % (ref 40.0–52.0)
Hemoglobin: 14.6 g/dL (ref 13.0–18.0)
LYMPHS PCT: 26 %
Lymphs Abs: 1.7 10*3/uL (ref 1.0–3.6)
MCH: 32.3 pg (ref 26.0–34.0)
MCHC: 35.2 g/dL (ref 32.0–36.0)
MCV: 91.7 fL (ref 80.0–100.0)
MONO ABS: 0.4 10*3/uL (ref 0.2–1.0)
MONOS PCT: 7 %
Neutro Abs: 4.1 10*3/uL (ref 1.4–6.5)
Neutrophils Relative %: 62 %
PLATELETS: 217 10*3/uL (ref 150–440)
RBC: 4.52 MIL/uL (ref 4.40–5.90)
RDW: 12.9 % (ref 11.5–14.5)
WBC: 6.6 10*3/uL (ref 3.8–10.6)

## 2017-01-28 LAB — PROTIME-INR
INR: 0.94
PROTHROMBIN TIME: 12.6 s (ref 11.4–15.2)

## 2017-01-28 MED ORDER — BUPIVACAINE HCL (PF) 0.5 % IJ SOLN: 30.0000 mL | Freq: Once | INTRAMUSCULAR | Status: DC

## 2017-01-28 MED ORDER — LIDOCAINE-EPINEPHRINE-TETRACAINE (LET) SOLUTION
3.0000 mL | Freq: Once | NASAL | Status: AC
  Administered 2017-01-28: 3 mL via TOPICAL
  Filled 2017-01-28: qty 3

## 2017-01-28 MED ORDER — TETANUS-DIPHTH-ACELL PERTUSSIS 5-2.5-18.5 LF-MCG/0.5 IM SUSP
0.5000 mL | Freq: Once | INTRAMUSCULAR | Status: AC
  Administered 2017-01-28: 0.5 mL via INTRAMUSCULAR
  Filled 2017-01-28: qty 0.5

## 2017-01-28 MED ORDER — BACITRACIN ZINC 500 UNIT/GM EX OINT
TOPICAL_OINTMENT | CUTANEOUS | Status: AC
  Filled 2017-01-28: qty 0.9

## 2017-01-28 NOTE — ED Notes (Signed)
Skin pale.  Patient c/o feeling dizzy.

## 2017-01-28 NOTE — ED Provider Notes (Signed)
[facility] Emergency Department Provider Note    First MD Initiated Contact with Patient 01/28/17 1206     (approximate)  I have reviewed the triage vital signs and the nursing notes.   HISTORY  Chief Complaint Head Injury    HPI CARSEN Richardson is a 51 y.o. male presents with injury to head after a large PVC pipe broke off shot in the area roughly 10-15 feet and came down onto his head. There was no LOC but a large amount of blood. Did feel nauseated and lightheaded. Denies any other injury. He is not on any blood thinners other than aspirin. Shortly prior to arrival.   Past Medical History:  Diagnosis Date  . Atrial fibrillation [facility]) Oct 2014   one episode  . Hyperlipidemia   . Sleep apnea    CPAP   Family History  Problem Relation Age of Onset  . COPD Mother   . Cancer Mother     kidney  . Hypertension Father   . Cancer Other 25    niece/rectal cancer  . Colon polyps Daughter 42    colon polyp   Past Surgical History:  Procedure Laterality Date  . COLONOSCOPY WITH PROPOFOL N/A 05/30/2015   Procedure: COLONOSCOPY WITH PROPOFOL;  Surgeon: Robert Bellow, MD;  Location: [facility] ENDOSCOPY;  Service: Endoscopy;  Laterality: N/A;  . ESOPHAGOGASTRODUODENOSCOPY N/A 05/30/2015   Procedure: ESOPHAGOGASTRODUODENOSCOPY (EGD);  Surgeon: Robert Bellow, MD;  Location: [facility] ENDOSCOPY;  Service: Endoscopy;  Laterality: N/A;  . MANDIBLE FRACTURE SURGERY    . MOLE REMOVAL    . NASAL SINUS SURGERY     x2  . TONSILLECTOMY     Patient Active Problem List   Diagnosis Date Noted  . Atypical chest pain 08/31/2015  . Ventricular ectopy 08/31/2015  . Family history of colonic polyps 04/05/2015  . Esophageal spasm 04/05/2015  . Atrial fibrillation ([place]) 07/25/2011  . OSA (obstructive sleep apnea) 07/25/2011  . Hyperlipidemia 07/25/2011      Prior to Admission medications   Medication Sig Start Date End Date Taking? Authorizing Provider    albuterol (PROVENTIL HFA;VENTOLIN HFA) 108 (90 BASE) MCG/ACT inhaler Inhale 2 puffs into the lungs every 6 (six) hours as needed for wheezing or shortness of breath. Patient not taking: Reported on 07/22/2016 08/31/15   Minna Merritts, MD  aspirin 81 MG tablet Take 81 mg by mouth daily.    Historical Provider, MD  AXIRON 30 MG/ACT SOLN as directed. 07/10/11   Historical Provider, MD  Cholecalciferol (VITAMIN D) 400 UNITS capsule Take 400 Units by mouth daily.     Historical Provider, MD  Cyanocobalamin (RA VITAMIN B-12 TR) 1000 MCG TBCR Take 1,000 mcg by mouth daily.     Historical Provider, MD  diltiazem (CARDIZEM) 30 MG tablet Take 1 tablet (30 mg total) by mouth 3 (three) times daily as needed. 09/14/15   Minna Merritts, MD  finasteride (PROSCAR) 5 MG tablet Take 5 mg by mouth daily.  06/17/13   Historical Provider, MD  flecainide (TAMBOCOR) 50 MG tablet Take 1 tablet (50 mg total) by mouth 2 (two) times daily as needed. 09/14/15   Minna Merritts, MD  fluticasone (FLONASE) 50 MCG/ACT nasal spray Place 2 sprays into both nostrils daily.     Historical Provider, MD  nebivolol (BYSTOLIC) 5 MG tablet Take 1 tablet (5 mg total) by mouth daily as needed. 07/22/16   Minna Merritts, MD  NON FORMULARY CPAP machine  Historical Provider, MD  simvastatin (ZOCOR) 10 MG tablet Take 1 tablet by mouth every evening.  07/18/11   Historical Provider, MD    Allergies Patient has no known allergies.    Social History Social History  Substance Use Topics  . Smoking status: Never Smoker  . Smokeless tobacco: Never Used  . Alcohol use 0.0 oz/week     Comment: rare, 2/week    Review of Systems Patient denies headaches, rhinorrhea, blurry vision, numbness, shortness of breath, chest pain, edema, cough, abdominal pain, nausea, vomiting, diarrhea, dysuria, fevers, rashes or hallucinations unless otherwise stated above in HPI. ____________________________________________   PHYSICAL EXAM:  VITAL  SIGNS: Vitals:   01/28/17 1143  BP: (!) 98/50  Pulse: (!) 52  Resp: 16  Temp: 97.9 F (36.6 C)    Constitutional: Alert and oriented. Well appearing and in no acute distress. Eyes: Conjunctivae are normal. PERRL. EOMI. Head: 4cm full thickness laceration to top of head with galeal involvement hemostatic Nose: No congestion/rhinnorhea. Mouth/Throat: Mucous membranes are moist.  Oropharynx non-erythematous. Neck: No stridor. Painless ROM. No cervical spine tenderness to palpation Hematological/Lymphatic/Immunilogical: No cervical lymphadenopathy. Cardiovascular: Normal rate, regular rhythm. Grossly normal heart sounds.  Good peripheral circulation. Respiratory: Normal respiratory effort.  No retractions. Lungs CTAB. Gastrointestinal: Soft and nontender. No distention. No abdominal bruits. No CVA tenderness. Genitourinary:  Musculoskeletal: No lower extremity tenderness nor edema.  No joint effusions. Neurologic:  Normal speech and language. No gross focal neurologic deficits are appreciated. No gait instability. Skin:  Skin is warm, dry and intact. No rash noted. Psychiatric: Mood and affect are normal. Speech and behavior are normal.  ____________________________________________   LABS (all labs ordered are listed, but only abnormal results are displayed)  Results for orders placed or performed during the hospital encounter of 01/28/17 (from the past 24 hour(s))  CBC with Differential/Platelet     Status: None   Collection Time: 01/28/17 12:19 PM  Result Value Ref Range   WBC 6.6 3.8 - 10.6 K/uL   RBC 4.52 4.40 - 5.90 MIL/uL   Hemoglobin 14.6 13.0 - 18.0 g/dL   HCT 41.4 40.0 - 52.0 %   MCV 91.7 80.0 - 100.0 fL   MCH 32.3 26.0 - 34.0 pg   MCHC 35.2 32.0 - 36.0 g/dL   RDW 12.9 11.5 - 14.5 %   Platelets 217 150 - 440 K/uL   Neutrophils Relative % 62 %   Neutro Abs 4.1 1.4 - 6.5 K/uL   Lymphocytes Relative 26 %   Lymphs Abs 1.7 1.0 - 3.6 K/uL   Monocytes Relative 7 %    Monocytes Absolute 0.4 0.2 - 1.0 K/uL   Eosinophils Relative 4 %   Eosinophils Absolute 0.3 0 - 0.7 K/uL   Basophils Relative 1 %   Basophils Absolute 0.0 0 - 0.1 K/uL  Basic metabolic panel     Status: Abnormal   Collection Time: 01/28/17 12:19 PM  Result Value Ref Range   Sodium 139 135 - 145 mmol/L   Potassium 3.2 (L) 3.5 - 5.1 mmol/L   Chloride 107 101 - 111 mmol/L   CO2 26 22 - 32 mmol/L   Glucose, Bld 127 (H) 65 - 99 mg/dL   BUN 18 6 - 20 mg/dL   Creatinine, Ser 1.12 0.61 - 1.24 mg/dL   Calcium 8.8 (L) 8.9 - 10.3 mg/dL   GFR calc non Af Amer >60 >60 mL/min   GFR calc Af Amer >60 >60 mL/min   Anion gap 6 5 -  15  Protime-INR     Status: None   Collection Time: 01/28/17 12:19 PM  Result Value Ref Range   Prothrombin Time 12.6 11.4 - 15.2 seconds   INR 0.94    ____________________________________________ ____________________________________________  RADIOLOGY  I personally reviewed all radiographic images ordered to evaluate for the above acute complaints and reviewed radiology reports and findings.  These findings were personally discussed with the patient.  Please see medical record for radiology report.  ____________________________________________   PROCEDURES  Procedure(s) performed:  Marland KitchenMarland KitchenLaceration Repair Date/Time: 01/28/2017 1:36 PM Performed by: Merlyn Lot Authorized by: Merlyn Lot   Consent:    Consent obtained:  Verbal   Consent given by:  Patient   Risks discussed:  Infection, pain, poor cosmetic result and poor wound healing   Alternatives discussed:  No treatment Anesthesia (see MAR for exact dosages):    Anesthesia method:  Topical application and local infiltration   Topical anesthetic:  LET   Local anesthetic:  Bupivacaine 0.5% w/o epi Laceration details:    Location:  Scalp   Scalp location:  Crown   Length (cm):  4   Depth (mm):  5 Repair type:    Repair type:  Intermediate Pre-procedure details:    Preparation:  Patient was  prepped and draped in usual sterile fashion Exploration:    Wound extent comment:  + galeal involvement   Contaminated: yes   Treatment:    Area cleansed with:  Hibiclens   Amount of cleaning:  Standard   Irrigation solution:  Sterile saline   Irrigation method:  Pressure wash   Visualized foreign bodies/material removed: yes   Fascia repair:    Suture size:  4-0   Suture material:  Vicryl   Suture technique:  Simple interrupted   Number of sutures:  2 Skin repair:    Repair method:  Staples   Number of staples:  10 Approximation:    Approximation:  Close   Vermilion border: well-aligned   Post-procedure details:    Dressing:  Open (no dressing)      Critical Care performed: no ____________________________________________   INITIAL IMPRESSION / ASSESSMENT AND PLAN / ED COURSE  Pertinent labs & imaging results that were available during my care of the patient were reviewed by me and considered in my medical decision making (see chart for details).  DDX: ich, laceration, contusion  Perry Richardson is a 51 y.o. who presents to the ED with acute head injury s/p PVC falling on head with 4 cm laceration on scalp as described above. Td updated. VSS. Exam with distal NV intact. + galeal involvement.  Plan lac repair.  CT imaging ordered to eval for ICH or fracture.  CT imaging with NAICA  Laceration was explored, irrigated, and repaired without complications. Small FB consistent with grass or green plastic removed.    Discussed at length wound care and infection precautions with patient.       ____________________________________________   FINAL CLINICAL IMPRESSION(S) / ED DIAGNOSES  Final diagnoses:  Injury of head, initial encounter  Laceration of scalp, initial encounter      NEW MEDICATIONS STARTED DURING THIS VISIT:  Discharge Medication List as of 01/28/2017  1:38 PM       Note:  This document was prepared using Dragon voice recognition software and  may include unintentional dictation errors.    Merlyn Lot, MD 01/28/17 [phone]

## 2017-01-28 NOTE — ED Triage Notes (Signed)
Picking up a pipe and pipe broke off, flew up into the air and landed on head.  Denies LOC.  Laceration to top of head. Bleeding controlled.  Patient feeling lightheaded.

## 2019-02-21 ENCOUNTER — Telehealth: Payer: BLUE CROSS/BLUE SHIELD | Admitting: Nurse Practitioner

## 2019-02-21 DIAGNOSIS — B9789 Other viral agents as the cause of diseases classified elsewhere: Secondary | ICD-10-CM

## 2019-02-21 DIAGNOSIS — J069 Acute upper respiratory infection, unspecified: Secondary | ICD-10-CM

## 2019-02-21 DIAGNOSIS — J329 Chronic sinusitis, unspecified: Secondary | ICD-10-CM

## 2019-02-21 NOTE — Progress Notes (Addendum)
   Subjective:    Patient ID: Perry Richardson, male    DOB: 1966/06/03, 53 y.o.   MRN: [id number]  HPI Perry Richardson is on a telephonic visit with c/o cold like symptoms that started 2 days ago which some symptoms have improved and a temp of 98.3. He reports a "scratchy throat" and PND. He reports green nasal discharge which is now clear and a dull h/a 2/10 that improved with Tylenol. He denies fever, sore throat, ear pain, or facial pain. He c/o "expiratory" wheezing x 1 week with no SOB, cough or c/p.   Hx of nasal polyps followed by Dr. Gregary Signs and takes flonase daily.  Meds reviewed and hx of a fib with last occurrence a few years back and routinely takes testosterone med, flonase, finesteride and simvastatin as directed. Uses cpap for OSA.  Verbal consent to treat obtained today  Review of Systems  Constitutional: Negative for fever.  HENT: Positive for postnasal drip. Negative for sinus pressure, sinus pain and sore throat.   Respiratory: Positive for wheezing. Negative for cough and shortness of breath.   Cardiovascular: Negative for chest pain.       Objective:   Physical Exam Alert answers questions appropriately and promptly      Assessment & Plan:

## 2019-02-21 NOTE — Patient Instructions (Signed)
Perry Richardson please increase your fluid intake and continue to follow social distancing measures at work with good hand hygiene Please start Allegra to assist with the drainage and if you do not have a current inhaler please let me know so that I can send one in to [facility] Encouraged patient to call the office if no improvement in symptoms or if symptoms change or worsen after 48 hours of planned treatment. Patient verbalized understanding of all instructions given/reviewed and has no further questions or concerns at this time.   As discussed, if you continue to have wheezing after 48 hours of inhaler a steroid may be necessary to help with the inflammation

## 2019-03-16 ENCOUNTER — Telehealth: Payer: Self-pay

## 2019-03-16 NOTE — Telephone Encounter (Signed)
Called patient.  No answer. LMOV.  Need to change appt to a virtual and move appt to a virtual slot.

## 2019-03-17 NOTE — Telephone Encounter (Signed)
Called patient.  No answer. LMOV.  Need to change appointment to an Evisit and move appointment to a virtual slot.

## 2019-03-17 NOTE — Telephone Encounter (Signed)
Patient returned my call. Made him aware Dr. Rockey Situ was still limiting patients coming into the office.  Offered a telehealth appt. Patient did not feel this was needed.  Patient has no currently Symptoms at this time.

## 2019-03-24 ENCOUNTER — Ambulatory Visit: Payer: BLUE CROSS/BLUE SHIELD | Admitting: Cardiovascular Disease

## 2019-05-16 ENCOUNTER — Encounter: Payer: Self-pay | Admitting: General Surgery

## 2019-05-30 DIAGNOSIS — D2261 Melanocytic nevi of right upper limb, including shoulder: Secondary | ICD-10-CM | POA: Diagnosis not present

## 2019-05-30 DIAGNOSIS — D225 Melanocytic nevi of trunk: Secondary | ICD-10-CM | POA: Diagnosis not present

## 2019-05-30 DIAGNOSIS — D2262 Melanocytic nevi of left upper limb, including shoulder: Secondary | ICD-10-CM | POA: Diagnosis not present

## 2019-05-30 DIAGNOSIS — Z08 Encounter for follow-up examination after completed treatment for malignant neoplasm: Secondary | ICD-10-CM | POA: Diagnosis not present

## 2019-05-30 DIAGNOSIS — L570 Actinic keratosis: Secondary | ICD-10-CM | POA: Diagnosis not present

## 2019-05-30 DIAGNOSIS — L821 Other seborrheic keratosis: Secondary | ICD-10-CM | POA: Diagnosis not present

## 2019-05-30 DIAGNOSIS — X32XXXA Exposure to sunlight, initial encounter: Secondary | ICD-10-CM | POA: Diagnosis not present

## 2019-05-30 DIAGNOSIS — D2272 Melanocytic nevi of left lower limb, including hip: Secondary | ICD-10-CM | POA: Diagnosis not present

## 2019-05-30 DIAGNOSIS — D2271 Melanocytic nevi of right lower limb, including hip: Secondary | ICD-10-CM | POA: Diagnosis not present

## 2019-05-30 DIAGNOSIS — Z85828 Personal history of other malignant neoplasm of skin: Secondary | ICD-10-CM | POA: Diagnosis not present

## 2019-06-01 DIAGNOSIS — N401 Enlarged prostate with lower urinary tract symptoms: Secondary | ICD-10-CM | POA: Diagnosis not present

## 2019-06-01 DIAGNOSIS — Z79899 Other long term (current) drug therapy: Secondary | ICD-10-CM | POA: Diagnosis not present

## 2019-06-01 DIAGNOSIS — E291 Testicular hypofunction: Secondary | ICD-10-CM | POA: Diagnosis not present

## 2019-06-06 DIAGNOSIS — N401 Enlarged prostate with lower urinary tract symptoms: Secondary | ICD-10-CM | POA: Diagnosis not present

## 2019-06-06 DIAGNOSIS — E291 Testicular hypofunction: Secondary | ICD-10-CM | POA: Diagnosis not present

## 2019-06-06 DIAGNOSIS — Z79899 Other long term (current) drug therapy: Secondary | ICD-10-CM | POA: Diagnosis not present

## 2019-06-14 ENCOUNTER — Other Ambulatory Visit: Payer: Self-pay | Admitting: Internal Medicine

## 2019-07-04 DIAGNOSIS — R399 Unspecified symptoms and signs involving the genitourinary system: Secondary | ICD-10-CM | POA: Diagnosis not present

## 2019-07-04 DIAGNOSIS — Z03818 Encounter for observation for suspected exposure to other biological agents ruled out: Secondary | ICD-10-CM | POA: Diagnosis not present

## 2019-07-04 DIAGNOSIS — R509 Fever, unspecified: Secondary | ICD-10-CM | POA: Diagnosis not present

## 2019-07-04 DIAGNOSIS — K529 Noninfective gastroenteritis and colitis, unspecified: Secondary | ICD-10-CM | POA: Diagnosis not present

## 2019-07-06 ENCOUNTER — Other Ambulatory Visit: Payer: Self-pay | Admitting: Internal Medicine

## 2019-07-06 ENCOUNTER — Ambulatory Visit
Admission: RE | Admit: 2019-07-06 | Discharge: 2019-07-06 | Disposition: A | Discharge status: Home / Self Care | Payer: 59 | Source: Ambulatory Visit | Attending: Internal Medicine | Admitting: Internal Medicine

## 2019-07-06 ENCOUNTER — Other Ambulatory Visit: Payer: Self-pay

## 2019-07-06 DIAGNOSIS — R1031 Right lower quadrant pain: Secondary | ICD-10-CM | POA: Insufficient documentation

## 2019-07-06 DIAGNOSIS — R109 Unspecified abdominal pain: Secondary | ICD-10-CM | POA: Diagnosis not present

## 2019-07-06 DIAGNOSIS — R1032 Left lower quadrant pain: Secondary | ICD-10-CM

## 2019-07-06 DIAGNOSIS — K5732 Diverticulitis of large intestine without perforation or abscess without bleeding: Secondary | ICD-10-CM | POA: Diagnosis not present

## 2019-07-06 MED ORDER — IOHEXOL 300 MG/ML  SOLN
100.0000 mL | Freq: Once | INTRAMUSCULAR | Status: AC | PRN
  Administered 2019-07-06: 100 mL via INTRAVENOUS

## 2019-07-09 ENCOUNTER — Other Ambulatory Visit: Payer: Self-pay | Admitting: Surgery

## 2019-07-09 NOTE — Progress Notes (Signed)
07/09/19 3:04 pm  Patient called the [facility] answering service.  He reports he was started on Augmentin for diverticulitis and has noticed that though his symptoms have somewhat improved, that he's not fully improved yet and was wondering about changing antibiotics.  He reports that he's not having full fevers like before, and his pain is better, and he's able to tolerate some po intake.  He has been advancing from clear liquids that were recommended on 9/23 and now to a bland diet.  His worry is that he wants to be better by Monday when he returns to work.  Discussed with the patient that we can try Cipro and Flagyl, but that if there is no improvement, or if there is any worsening, he may have to come to the ED for further evaluation in case his diverticulitis is failing outpatient medical management.  Patient understands this plan.  Will order Cipro 500 mg BID and Flagyl 500 mg TID for 10 day course.  Called in to [facility] in [facility].  Olean Ree, MD

## 2019-07-19 DIAGNOSIS — K5712 Diverticulitis of small intestine without perforation or abscess without bleeding: Secondary | ICD-10-CM | POA: Diagnosis not present

## 2019-07-20 DIAGNOSIS — K5700 Diverticulitis of small intestine with perforation and abscess without bleeding: Secondary | ICD-10-CM | POA: Diagnosis not present

## 2019-07-25 ENCOUNTER — Other Ambulatory Visit: Payer: Self-pay | Admitting: General Surgery

## 2019-07-25 DIAGNOSIS — K5732 Diverticulitis of large intestine without perforation or abscess without bleeding: Secondary | ICD-10-CM

## 2019-07-26 ENCOUNTER — Ambulatory Visit
Admission: RE | Admit: 2019-07-26 | Discharge: 2019-07-26 | Disposition: A | Discharge status: Home / Self Care | Payer: 59 | Source: Ambulatory Visit | Attending: General Surgery | Admitting: General Surgery

## 2019-07-26 ENCOUNTER — Other Ambulatory Visit: Payer: Self-pay

## 2019-07-26 DIAGNOSIS — K5732 Diverticulitis of large intestine without perforation or abscess without bleeding: Secondary | ICD-10-CM | POA: Diagnosis not present

## 2019-07-26 DIAGNOSIS — R109 Unspecified abdominal pain: Secondary | ICD-10-CM | POA: Diagnosis not present

## 2019-07-26 MED ORDER — IOHEXOL 300 MG/ML  SOLN
100.0000 mL | Freq: Once | INTRAMUSCULAR | Status: AC | PRN
  Administered 2019-07-26: 100 mL via INTRAVENOUS

## 2019-07-27 ENCOUNTER — Other Ambulatory Visit: Payer: Self-pay | Admitting: General Surgery

## 2019-07-27 DIAGNOSIS — B370 Candidal stomatitis: Secondary | ICD-10-CM

## 2019-07-27 MED ORDER — NYSTATIN 100000 UNIT/ML MT SUSP: OROMUCOSAL | 0 refills | Status: DC

## 2019-08-01 ENCOUNTER — Other Ambulatory Visit: Payer: Self-pay | Admitting: General Surgery

## 2019-08-01 DIAGNOSIS — B370 Candidal stomatitis: Secondary | ICD-10-CM

## 2019-08-01 MED ORDER — FLUCONAZOLE 150 MG PO TABS: ORAL_TABLET | ORAL | 0 refills | Status: DC

## 2019-08-01 NOTE — Progress Notes (Signed)
diflucand

## 2019-09-05 ENCOUNTER — Other Ambulatory Visit: Payer: Self-pay | Admitting: General Surgery

## 2019-09-05 DIAGNOSIS — K5792 Diverticulitis of intestine, part unspecified, without perforation or abscess without bleeding: Secondary | ICD-10-CM | POA: Diagnosis not present

## 2019-09-05 DIAGNOSIS — K5732 Diverticulitis of large intestine without perforation or abscess without bleeding: Secondary | ICD-10-CM

## 2019-09-05 MED ORDER — DICYCLOMINE HCL 20 MG PO TABS: 20.0000 mg | ORAL_TABLET | Freq: Three times a day (TID) | ORAL | 0 refills | Status: DC

## 2019-09-06 DIAGNOSIS — K5792 Diverticulitis of intestine, part unspecified, without perforation or abscess without bleeding: Secondary | ICD-10-CM | POA: Diagnosis not present

## 2019-09-23 ENCOUNTER — Ambulatory Visit: Payer: Self-pay

## 2019-09-23 ENCOUNTER — Other Ambulatory Visit: Payer: Self-pay

## 2019-09-23 DIAGNOSIS — Z23 Encounter for immunization: Secondary | ICD-10-CM

## 2019-09-29 ENCOUNTER — Other Ambulatory Visit: Payer: Self-pay

## 2019-09-29 DIAGNOSIS — E785 Hyperlipidemia, unspecified: Secondary | ICD-10-CM

## 2019-10-05 MED ORDER — SIMVASTATIN 10 MG PO TABS: 10.0000 mg | ORAL_TABLET | Freq: Every evening | ORAL | 0 refills | Status: DC

## 2019-10-19 ENCOUNTER — Ambulatory Visit: Payer: Self-pay

## 2019-10-19 ENCOUNTER — Other Ambulatory Visit: Payer: Self-pay

## 2019-10-19 DIAGNOSIS — Z0000 Encounter for general adult medical examination without abnormal findings: Secondary | ICD-10-CM

## 2019-10-19 LAB — POCT URINALYSIS DIPSTICK
Bilirubin, UA: NEGATIVE
Blood, UA: NEGATIVE
Glucose, UA: NEGATIVE
Ketones, UA: NEGATIVE
Leukocytes, UA: NEGATIVE
Nitrite, UA: NEGATIVE
Protein, UA: NEGATIVE
Spec Grav, UA: 1.015 (ref 1.010–1.025)
Urobilinogen, UA: 0.2 E.U./dL
pH, UA: 7.5 (ref 5.0–8.0)

## 2019-10-19 NOTE — Progress Notes (Signed)
Scheduled to complete physical 10/25/2019 with Dr. Dannial Monarch (Interim Provider).  AMD

## 2019-10-20 LAB — CMP12+LP+TP+TSH+6AC+PSA+CBC…
ALT: 31 IU/L (ref 0–44)
AST: 20 IU/L (ref 0–40)
Albumin/Globulin Ratio: 2.5 — ABNORMAL HIGH (ref 1.2–2.2)
Albumin: 4.2 g/dL (ref 3.8–4.9)
Alkaline Phosphatase: 53 IU/L (ref 39–117)
BUN/Creatinine Ratio: 19 (ref 9–20)
BUN: 19 mg/dL (ref 6–24)
Basophils Absolute: 0.1 10*3/uL (ref 0.0–0.2)
Basos: 1 %
Bilirubin Total: 0.5 mg/dL (ref 0.0–1.2)
Calcium: 9.3 mg/dL (ref 8.7–10.2)
Chloride: 103 mmol/L (ref 96–106)
Chol/HDL Ratio: 4.2 ratio (ref 0.0–5.0)
Cholesterol, Total: 210 mg/dL — ABNORMAL HIGH (ref 100–199)
Creatinine, Ser: 1.02 mg/dL (ref 0.76–1.27)
EOS (ABSOLUTE): 0.2 10*3/uL (ref 0.0–0.4)
Eos: 4 %
Estimated CHD Risk: 0.8 times avg. (ref 0.0–1.0)
Free Thyroxine Index: 1.2 (ref 1.2–4.9)
GFR calc Af Amer: 95 mL/min/{1.73_m2} (ref >59)
GFR calc non Af Amer: 82 mL/min/{1.73_m2} (ref >59)
GGT: 18 IU/L (ref 0–65)
Globulin, Total: 1.7 g/dL (ref 1.5–4.5)
Glucose: 91 mg/dL (ref 65–99)
HDL: 50 mg/dL (ref >39)
Hematocrit: 42.6 % (ref 37.5–51.0)
Hemoglobin: 14.9 g/dL (ref 13.0–17.7)
Immature Grans (Abs): 0 10*3/uL (ref 0.0–0.1)
Immature Granulocytes: 0 %
Iron: 141 ug/dL (ref 38–169)
LDH: 170 IU/L (ref 121–224)
LDL Chol Calc (NIH): 133 mg/dL — ABNORMAL HIGH (ref 0–99)
Lymphocytes Absolute: 1.5 10*3/uL (ref 0.7–3.1)
Lymphs: 27 %
MCH: 30.8 pg (ref 26.6–33.0)
MCHC: 35 g/dL (ref 31.5–35.7)
MCV: 88 fL (ref 79–97)
Monocytes Absolute: 0.4 10*3/uL (ref 0.1–0.9)
Monocytes: 7 %
Neutrophils Absolute: 3.3 10*3/uL (ref 1.4–7.0)
Neutrophils: 61 %
Phosphorus: 3.6 mg/dL (ref 2.8–4.1)
Platelets: 203 10*3/uL (ref 150–450)
Potassium: 4.3 mmol/L (ref 3.5–5.2)
Prostate Specific Ag, Serum: 0.7 ng/mL (ref 0.0–4.0)
RBC: 4.83 x10E6/uL (ref 4.14–5.80)
RDW: 13.3 % (ref 11.6–15.4)
Sodium: 140 mmol/L (ref 134–144)
T3 Uptake Ratio: 25 % (ref 24–39)
T4, Total: 4.9 ug/dL (ref 4.5–12.0)
TSH: 1.16 u[IU]/mL (ref 0.450–4.500)
Total Protein: 5.9 g/dL — ABNORMAL LOW (ref 6.0–8.5)
Triglycerides: 149 mg/dL (ref 0–149)
Uric Acid: 6.1 mg/dL (ref 3.8–8.4)
VLDL Cholesterol Cal: 27 mg/dL (ref 5–40)
WBC: 5.5 10*3/uL (ref 3.4–10.8)

## 2019-10-25 ENCOUNTER — Ambulatory Visit: Payer: Self-pay | Admitting: Occupational Medicine

## 2019-10-25 ENCOUNTER — Other Ambulatory Visit: Payer: Self-pay

## 2019-10-25 ENCOUNTER — Encounter: Payer: Self-pay | Admitting: Occupational Medicine

## 2019-10-25 VITALS — BP 116/88 | HR 71 | Temp 98.9°F | Ht 70.0 in | Wt 212.0 lb

## 2019-10-25 DIAGNOSIS — Z0000 Encounter for general adult medical examination without abnormal findings: Secondary | ICD-10-CM

## 2019-10-25 NOTE — Progress Notes (Signed)
   Subjective:    Patient ID: Perry Richardson, male    DOB: Feb 05, 1966, 54 y.o.   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  He is 54 years old.  He has a history of A. fib couple of years ago and was treated medically.  No recurrence.  No symptoms.  He sees cardiology annually.  Also has a history of obstructive sleep apnea and uses CPAP regularly without problems.  No reports of daytime sleepiness.  Also has history of dyslipidemia which is controlled on simvastatin.  Since his last annual physical here at the employer sponsored onsite health clinic he had a complicated bout of diverticulitis in November 2020.  He was not hospitalized and did not have surgery but did require about 3 weeks to resolve with antibiotics.  He has managed by GI Dr. Bary Castilla and is in the process of scheduling a colonoscopy.  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    Review of Systems     Objective:   Physical Exam Today's Vitals   10/25/19 1021  BP: 116/88  Pulse: 71  Temp: 98.9 F (37.2 C)  TempSrc: Oral  SpO2: 98%  Weight: 212 lb (96.2 kg)  Height: 5\' 10"  (1.778 m)  PainSc: 0-No pain   Body mass index is 30.42 kg/m.  Normal physical exam     Assessment & Plan:  Annual health exam completed today at employer sponsored medical clinic.  Several chronic issues are being managed outside of this clinic.  I will authorize 1 year refills on Flonase and simvastatin.  Labs drawn 10/19/2019 were normal.  EKG in our clinic today was normal.

## 2019-11-23 DIAGNOSIS — Z79899 Other long term (current) drug therapy: Secondary | ICD-10-CM | POA: Diagnosis not present

## 2019-11-23 DIAGNOSIS — N401 Enlarged prostate with lower urinary tract symptoms: Secondary | ICD-10-CM | POA: Diagnosis not present

## 2019-11-23 DIAGNOSIS — E291 Testicular hypofunction: Secondary | ICD-10-CM | POA: Diagnosis not present

## 2019-11-23 DIAGNOSIS — E6609 Other obesity due to excess calories: Secondary | ICD-10-CM | POA: Diagnosis not present

## 2019-12-13 ENCOUNTER — Other Ambulatory Visit: Payer: Self-pay | Admitting: General Surgery

## 2019-12-13 NOTE — Progress Notes (Signed)
May 30, 2015 colonoscopy (screening):  A 8 mm polyp was found in the ascending colon. The polyp was pedunculated. The polyp was removed with a hot snare. Resection and retrieval were complete.   Three sessile polyps were found in the distal transverse colon. The polyps were 5 to 10 mm in size. These polyps were removed with a hot snare. Resection and retrieval were complete.  C. COLON POLYP X 2, DISTAL TRANSVERSE; COLD BIOPSY:  - TUBULAR ADENOMAS, MULTIPLE FRAGMENTS.  - NEGATIVE FOR HIGH-GRADE DYSPLASIA AND MALIGNANCY.   D. COLON POLYP, ASCENDING; HOT SNARE:  - TUBULAR ADENOMA, MULTIPLE FRAGMENTS.  - NEGATIVE FOR HIGH-GRADE DYSPLASIA AND MALIGNANCY

## 2019-12-26 ENCOUNTER — Other Ambulatory Visit
Admission: RE | Admit: 2019-12-26 | Discharge: 2019-12-26 | Disposition: A | Discharge status: Home / Self Care | Payer: 59 | Source: Ambulatory Visit | Attending: General Surgery | Admitting: General Surgery

## 2019-12-26 DIAGNOSIS — Z01812 Encounter for preprocedural laboratory examination: Secondary | ICD-10-CM | POA: Diagnosis not present

## 2019-12-26 DIAGNOSIS — Z20822 Contact with and (suspected) exposure to covid-19: Secondary | ICD-10-CM | POA: Insufficient documentation

## 2019-12-26 LAB — SARS CORONAVIRUS 2 (TAT 6-24 HRS): SARS Coronavirus 2: NEGATIVE

## 2019-12-28 ENCOUNTER — Encounter
Admission: RE | Disposition: A | Discharge status: Home / Self Care | Payer: Self-pay | Source: Home / Self Care | Attending: General Surgery

## 2019-12-28 ENCOUNTER — Ambulatory Visit
Admission: RE | Admit: 2019-12-28 | Discharge: 2019-12-28 | Disposition: A | Discharge status: Home / Self Care | Payer: 59 | Attending: General Surgery | Admitting: General Surgery

## 2019-12-28 ENCOUNTER — Ambulatory Visit: Payer: 59 | Admitting: Certified Registered"

## 2019-12-28 ENCOUNTER — Other Ambulatory Visit: Payer: Self-pay

## 2019-12-28 ENCOUNTER — Encounter: Payer: Self-pay | Admitting: General Surgery

## 2019-12-28 DIAGNOSIS — K5792 Diverticulitis of intestine, part unspecified, without perforation or abscess without bleeding: Secondary | ICD-10-CM | POA: Diagnosis not present

## 2019-12-28 DIAGNOSIS — Z79899 Other long term (current) drug therapy: Secondary | ICD-10-CM | POA: Insufficient documentation

## 2019-12-28 DIAGNOSIS — Z86010 Personal history of colonic polyps: Secondary | ICD-10-CM | POA: Insufficient documentation

## 2019-12-28 DIAGNOSIS — G4730 Sleep apnea, unspecified: Secondary | ICD-10-CM | POA: Insufficient documentation

## 2019-12-28 DIAGNOSIS — K5730 Diverticulosis of large intestine without perforation or abscess without bleeding: Secondary | ICD-10-CM | POA: Diagnosis not present

## 2019-12-28 DIAGNOSIS — E785 Hyperlipidemia, unspecified: Secondary | ICD-10-CM | POA: Insufficient documentation

## 2019-12-28 DIAGNOSIS — D123 Benign neoplasm of transverse colon: Secondary | ICD-10-CM | POA: Insufficient documentation

## 2019-12-28 DIAGNOSIS — Z1211 Encounter for screening for malignant neoplasm of colon: Secondary | ICD-10-CM | POA: Diagnosis not present

## 2019-12-28 DIAGNOSIS — K5790 Diverticulosis of intestine, part unspecified, without perforation or abscess without bleeding: Secondary | ICD-10-CM | POA: Diagnosis not present

## 2019-12-28 DIAGNOSIS — K635 Polyp of colon: Secondary | ICD-10-CM | POA: Diagnosis not present

## 2019-12-28 HISTORY — PX: COLONOSCOPY WITH PROPOFOL: SHX5780

## 2019-12-28 SURGERY — COLONOSCOPY WITH PROPOFOL
Anesthesia: General

## 2019-12-28 MED ORDER — PROPOFOL 500 MG/50ML IV EMUL
INTRAVENOUS | Status: AC
  Filled 2019-12-28: qty 50

## 2019-12-28 MED ORDER — SODIUM CHLORIDE 0.9 % IV SOLN
INTRAVENOUS | Status: DC
  Administered 2019-12-28: 1000 mL via INTRAVENOUS

## 2019-12-28 MED ORDER — PROPOFOL 10 MG/ML IV BOLUS
INTRAVENOUS | Status: AC
  Filled 2019-12-28: qty 20

## 2019-12-28 MED ORDER — PROPOFOL 500 MG/50ML IV EMUL
INTRAVENOUS | Status: DC | PRN
  Administered 2019-12-28: 150 ug/kg/min via INTRAVENOUS

## 2019-12-28 MED ORDER — PROPOFOL 10 MG/ML IV BOLUS
INTRAVENOUS | Status: DC | PRN
  Administered 2019-12-28: 60 mg via INTRAVENOUS

## 2019-12-28 NOTE — H&P (Signed)
Perry Richardson [id number] Aug 28, 1966     HPI:  Healthy 54 y/o male with past history of tubular adenomas in the distal transverse as well as proximal ascending colon in 2016.  For follow up exam. Patient had an episode of sigmoid diverticulitis last fall which resolved with oral antibiotics.   Medications Prior to Admission  Medication Sig Dispense Refill Last Dose  . albuterol (PROVENTIL HFA;VENTOLIN HFA) 108 (90 BASE) MCG/ACT inhaler Inhale 2 puffs into the lungs every 6 (six) hours as needed for wheezing or shortness of breath. 1 Inhaler 2 Past Week at Unknown time  . AXIRON 30 MG/ACT SOLN as directed.   Past Week at Unknown time  . Cholecalciferol (VITAMIN D) 400 UNITS capsule Take 400 Units by mouth daily.    Past Week at Unknown time  . Cyanocobalamin (RA VITAMIN B-12 TR) 1000 MCG TBCR Take 1,000 mcg by mouth daily.    Past Week at Unknown time  . dicyclomine (BENTYL) 20 MG tablet Take 1 tablet (20 mg total) by mouth 4 (four) times daily -  before meals and at bedtime. 50 tablet 0 Past Week at Unknown time  . finasteride (PROSCAR) 5 MG tablet Take 5 mg by mouth daily.    12/27/2019 at Unknown time  . fluconazole (DIFLUCAN) 150 MG tablet Take one tablet today, repeat in three days. 2 tablet 0 Past Week at Unknown time  . fluticasone (FLONASE) 50 MCG/ACT nasal spray USE 2 SPRAYS IN EACH NOSTRIL ONCE DAILY AS DIRECTED 48 g 3 Past Week at Unknown time  . nebivolol (BYSTOLIC) 5 MG tablet Take 1 tablet (5 mg total) by mouth daily as needed. 30 tablet 6 Past Week at Unknown time  . NON FORMULARY CPAP machine   Past Week at Unknown time  . nystatin (MYCOSTATIN) 100000 UNIT/ML suspension 5 cc ( 1 teaspoon) swish and swallow four times a day until complete. 120 mL 0 Past Week at Unknown time  . simvastatin (ZOCOR) 10 MG tablet Take 1 tablet (10 mg total) by mouth every evening. 90 tablet 0 12/27/2019 at Unknown time  . diltiazem (CARDIZEM) 30 MG tablet Take 1 tablet (30 mg total) by mouth 3 (three)  times daily as needed. (Patient not taking: Reported on 12/28/2019) 90 tablet 3 Not Taking  . flecainide (TAMBOCOR) 50 MG tablet Take 1 tablet (50 mg total) by mouth 2 (two) times daily as needed. 60 tablet 3    No Known Allergies Past Medical History:  Diagnosis Date  . Atrial fibrillation [facility]) Oct 2014   one episode  . Diverticulitis   . Hyperlipidemia   . Sleep apnea    CPAP   Past Surgical History:  Procedure Laterality Date  . COLONOSCOPY WITH PROPOFOL N/A 05/30/2015   Procedure: COLONOSCOPY WITH PROPOFOL;  Surgeon: Robert Bellow, MD;  Location: [facility] ENDOSCOPY;  Service: Endoscopy;  Laterality: N/A;  . ESOPHAGOGASTRODUODENOSCOPY N/A 05/30/2015   Procedure: ESOPHAGOGASTRODUODENOSCOPY (EGD);  Surgeon: Robert Bellow, MD;  Location: [facility] ENDOSCOPY;  Service: Endoscopy;  Laterality: N/A;  . MANDIBLE FRACTURE SURGERY    . MOLE REMOVAL    . NASAL SINUS SURGERY     x2  . TONSILLECTOMY     Social History   Socioeconomic History  . Marital status: Married    Spouse name: Not on file  . Number of children: Not on file  . Years of education: Not on file  . Highest education level: Not on file  Occupational History  . Not on file  Tobacco Use  . Smoking status: Never Smoker  . Smokeless tobacco: Never Used  Substance and Sexual Activity  . Alcohol use: Yes    Alcohol/week: 0.0 standard drinks    Comment: rare, 2/week  . Drug use: No  . Sexual activity: Not on file  Other Topics Concern  . Not on file  Social History Narrative  . Not on file   Social Determinants of Health   Financial Resource Strain:   . Difficulty of Paying Living Expenses:   Food Insecurity:   . Worried About Charity fundraiser in the Last Year:   . Arboriculturist in the Last Year:   Transportation Needs:   . Film/video editor (Medical):   Marland Kitchen Lack of Transportation (Non-Medical):   Physical Activity:   . Days of Exercise per Week:   . Minutes of Exercise per Session:   Stress:   .  Feeling of Stress :   Social Connections:   . Frequency of Communication with Friends and Family:   . Frequency of Social Gatherings with Friends and Family:   . Attends Religious Services:   . Active Member of Clubs or Organizations:   . Attends Archivist Meetings:   Marland Kitchen Marital Status:   Intimate Partner Violence:   . Fear of Current or Ex-Partner:   . Emotionally Abused:   Marland Kitchen Physically Abused:   . Sexually Abused:    Social History   Social History Narrative  . Not on file     ROS: Negative.     PE: HEENT: Negative. Lungs: Clear. Cardio: [facility] [place] 12/28/2019   Assessment/Plan:  Proceed with planned colonoscopy.

## 2019-12-28 NOTE — Anesthesia Postprocedure Evaluation (Signed)
Anesthesia Post Note  Patient: Perry Richardson  Procedure(s) Performed: COLONOSCOPY WITH PROPOFOL (N/A )  Patient location during evaluation: Endoscopy Anesthesia Type: General Level of consciousness: awake and alert Pain management: pain level controlled Vital Signs Assessment: post-procedure vital signs reviewed and stable Respiratory status: spontaneous breathing, nonlabored ventilation, respiratory function stable and patient connected to nasal cannula oxygen Cardiovascular status: blood pressure returned to baseline and stable Postop Assessment: no apparent nausea or vomiting Anesthetic complications: no     Last Vitals:  Vitals:   12/28/19 0810 12/28/19 0820  BP: 97/69 105/71  Pulse: 61 63  Resp: 11 15  Temp:    SpO2: 96% 98%    Last Pain:  Vitals:   12/28/19 0820  TempSrc:   PainSc: 0-No pain                 Martha Clan

## 2019-12-28 NOTE — Transfer of Care (Signed)
Immediate Anesthesia Transfer of Care Note  Patient: Perry Richardson  Procedure(s) Performed: COLONOSCOPY WITH PROPOFOL (N/A )  Patient Location: PACU  Anesthesia Type:General  Level of Consciousness: awake, alert  and oriented  Airway & Oxygen Therapy: Patient Spontanous Breathing  Post-op Assessment: Report given to RN and Post -op Vital signs reviewed and stable  Post vital signs: Reviewed and stable  Last Vitals:  Vitals Value Taken Time  BP    Temp    Pulse    Resp    SpO2      Last Pain:  Vitals:   12/28/19 0707  TempSrc: Temporal  PainSc: 0-No pain         Complications: No apparent anesthesia complications

## 2019-12-28 NOTE — Anesthesia Preprocedure Evaluation (Signed)
Anesthesia Evaluation  Patient identified by MRN, date of birth, ID band Patient awake    Reviewed: Allergy & Precautions, NPO status , Patient's Chart, lab work & pertinent test results  History of Anesthesia Complications Negative for: history of anesthetic complications  Airway Mallampati: II       Dental  (+) Dental Advidsory Given, Teeth Intact   Pulmonary neg shortness of breath, sleep apnea , neg recent URI,    Pulmonary exam normal        Cardiovascular Exercise Tolerance: Good (-) hypertension(-) angina(-) Past MI and (-) Cardiac Stents Normal cardiovascular exam+ dysrhythmias Atrial Fibrillation (-) Valvular Problems/Murmurs Rhythm:Regular  One episode of Afib,  WNL rate now   Neuro/Psych negative neurological ROS  negative psych ROS   GI/Hepatic Neg liver ROS, Hx of esophageal spasm   Endo/Other  negative endocrine ROS  Renal/GU negative Renal ROS     Musculoskeletal negative musculoskeletal ROS (+)   Abdominal   Peds negative pediatric ROS (+)  Hematology negative hematology ROS (+)   Anesthesia Other Findings Past Medical History: Oct 2014: Atrial fibrillation [facility])     Comment:  one episode No date: Diverticulitis No date: Hyperlipidemia No date: Sleep apnea     Comment:  CPAP   Reproductive/Obstetrics                             Anesthesia Physical  Anesthesia Plan  ASA: II  Anesthesia Plan: General   Post-op Pain Management:    Induction: Intravenous  PONV Risk Score and Plan: Propofol infusion and TIVA  Airway Management Planned: Nasal Cannula and Natural Airway  Additional Equipment:   Intra-op Plan:   Post-operative Plan:   Informed Consent: I have reviewed the patients History and Physical, chart, labs and discussed the procedure including the risks, benefits and alternatives for the proposed anesthesia with the patient or authorized  representative who has indicated his/her understanding and acceptance.     Dental advisory given  Plan Discussed with: CRNA and Surgeon  Anesthesia Plan Comments:         Anesthesia Quick Evaluation

## 2019-12-28 NOTE — Op Note (Signed)
[facility] Gastroenterology Patient Name: Perry Richardson Procedure Date: 12/28/2019 7:25 AM MRN: [id number] Account #: [id number] Date of Birth: 1966-07-23 Admit Type: Outpatient Age: 54 Room: [facility] ENDO ROOM 1 Gender: Male Note Status: Finalized Procedure:             Colonoscopy Indications:           High risk colon cancer surveillance: Personal history                         of colonic polyps Providers:             Robert Bellow, MD Referring MD:          none Medicines:             Monitored Anesthesia Care Complications:         No immediate complications. Procedure:             Pre-Anesthesia Assessment:                        - Prior to the procedure, a History and Physical was                         performed, and patient medications, allergies and                         sensitivities were reviewed. The patient's tolerance                         of previous anesthesia was reviewed.                        - The risks and benefits of the procedure and the                         sedation options and risks were discussed with the                         patient. All questions were answered and informed                         consent was obtained.                        After obtaining informed consent, the colonoscope was                         passed under direct vision. Throughout the procedure,                         the patient's blood pressure, pulse, and oxygen                         saturations were monitored continuously. The                         Colonoscope was introduced through the anus and                         advanced to the the cecum, identified by appendiceal  orifice and ileocecal valve. The colonoscopy was                         performed without difficulty. The patient tolerated                         the procedure well. The quality of the bowel                         preparation was  excellent. Findings:      Two semi-pedunculated polyps were found in the descending colon, mid       descending colon and distal descending colon. The polyps were 10 mm in       size. These polyps were removed with a hot snare. Resection and       retrieval were complete.      The retroflexed view of the distal rectum and anal verge was normal and       showed no anal or rectal abnormalities.      A few small-mouthed diverticula were found in the descending colon. Impression:            - Two 10 mm polyps in the descending colon, in the mid                         descending colon and in the distal descending colon,                         removed with a hot snare. Resected and retrieved.                        - The distal rectum and anal verge are normal on                         retroflexion view. Recommendation:        - Telephone endoscopist for pathology results in 1                         week.                        - High fiber diet indefinitely. Procedure Code(s):     --- Professional ---                        [phone], Colonoscopy, flexible; with removal of                         tumor(s), polyp(s), or other lesion(s) by snare                         technique Diagnosis Code(s):     --- Professional ---                        Z86.010, Personal history of colonic polyps                        K63.5, Polyp of colon CPT copyright 2019 [employer]. All rights reserved. The codes documented in this report are preliminary and upon coder review may  be revised to meet current compliance requirements. Robert Bellow, MD 12/28/2019 8:00:00 AM This report has been signed electronically. Number of Addenda: 0 Note Initiated On: 12/28/2019 7:25 AM Scope Withdrawal Time: 0 hours 17 minutes 43 seconds  Total Procedure Duration: 0 hours 21 minutes 28 seconds  Estimated Blood Loss:  Estimated blood loss: none.      [facility]

## 2019-12-29 ENCOUNTER — Encounter: Payer: Self-pay | Admitting: *Deleted

## 2019-12-29 LAB — SURGICAL PATHOLOGY

## 2020-01-12 ENCOUNTER — Other Ambulatory Visit: Payer: Self-pay

## 2020-01-12 DIAGNOSIS — E785 Hyperlipidemia, unspecified: Secondary | ICD-10-CM

## 2020-01-12 MED ORDER — SIMVASTATIN 10 MG PO TABS: 10.0000 mg | ORAL_TABLET | Freq: Every evening | ORAL | 2 refills | Status: DC

## 2020-02-07 DIAGNOSIS — Z85828 Personal history of other malignant neoplasm of skin: Secondary | ICD-10-CM | POA: Diagnosis not present

## 2020-02-07 DIAGNOSIS — D2262 Melanocytic nevi of left upper limb, including shoulder: Secondary | ICD-10-CM | POA: Diagnosis not present

## 2020-02-07 DIAGNOSIS — L821 Other seborrheic keratosis: Secondary | ICD-10-CM | POA: Diagnosis not present

## 2020-02-07 DIAGNOSIS — D225 Melanocytic nevi of trunk: Secondary | ICD-10-CM | POA: Diagnosis not present

## 2020-02-07 DIAGNOSIS — X32XXXA Exposure to sunlight, initial encounter: Secondary | ICD-10-CM | POA: Diagnosis not present

## 2020-02-07 DIAGNOSIS — L570 Actinic keratosis: Secondary | ICD-10-CM | POA: Diagnosis not present

## 2020-02-07 DIAGNOSIS — D2271 Melanocytic nevi of right lower limb, including hip: Secondary | ICD-10-CM | POA: Diagnosis not present

## 2020-02-07 DIAGNOSIS — D2261 Melanocytic nevi of right upper limb, including shoulder: Secondary | ICD-10-CM | POA: Diagnosis not present

## 2020-02-14 DIAGNOSIS — L820 Inflamed seborrheic keratosis: Secondary | ICD-10-CM | POA: Diagnosis not present

## 2020-05-04 DIAGNOSIS — K219 Gastro-esophageal reflux disease without esophagitis: Secondary | ICD-10-CM | POA: Diagnosis not present

## 2020-05-04 DIAGNOSIS — H6980 Other specified disorders of Eustachian tube, unspecified ear: Secondary | ICD-10-CM | POA: Diagnosis not present

## 2020-05-04 DIAGNOSIS — R69 Illness, unspecified: Secondary | ICD-10-CM | POA: Diagnosis not present

## 2020-05-04 DIAGNOSIS — J0181 Other acute recurrent sinusitis: Secondary | ICD-10-CM | POA: Diagnosis not present

## 2020-05-10 DIAGNOSIS — J0181 Other acute recurrent sinusitis: Secondary | ICD-10-CM | POA: Diagnosis not present

## 2020-05-10 DIAGNOSIS — K219 Gastro-esophageal reflux disease without esophagitis: Secondary | ICD-10-CM | POA: Diagnosis not present

## 2020-05-30 DIAGNOSIS — Z79899 Other long term (current) drug therapy: Secondary | ICD-10-CM | POA: Diagnosis not present

## 2020-05-30 DIAGNOSIS — E291 Testicular hypofunction: Secondary | ICD-10-CM | POA: Diagnosis not present

## 2020-05-30 DIAGNOSIS — N401 Enlarged prostate with lower urinary tract symptoms: Secondary | ICD-10-CM | POA: Diagnosis not present

## 2020-06-11 ENCOUNTER — Ambulatory Visit: Payer: 59 | Admitting: Cardiovascular Disease

## 2020-06-12 DIAGNOSIS — Z79899 Other long term (current) drug therapy: Secondary | ICD-10-CM | POA: Diagnosis not present

## 2020-06-12 DIAGNOSIS — E291 Testicular hypofunction: Secondary | ICD-10-CM | POA: Diagnosis not present

## 2020-06-12 DIAGNOSIS — N401 Enlarged prostate with lower urinary tract symptoms: Secondary | ICD-10-CM | POA: Diagnosis not present

## 2020-06-29 ENCOUNTER — Other Ambulatory Visit: Payer: Self-pay | Admitting: Physician Assistant

## 2020-06-29 DIAGNOSIS — E785 Hyperlipidemia, unspecified: Secondary | ICD-10-CM

## 2020-07-31 DIAGNOSIS — R7989 Other specified abnormal findings of blood chemistry: Secondary | ICD-10-CM | POA: Insufficient documentation

## 2020-07-31 DIAGNOSIS — N400 Enlarged prostate without lower urinary tract symptoms: Secondary | ICD-10-CM

## 2020-07-31 HISTORY — DX: Benign prostatic hyperplasia without lower urinary tract symptoms: N40.0

## 2020-08-07 ENCOUNTER — Ambulatory Visit
Admission: RE | Admit: 2020-08-07 | Discharge: 2020-08-07 | Disposition: A | Discharge status: Home / Self Care | Payer: 59 | Attending: Otolaryngology | Admitting: Otolaryngology

## 2020-08-07 ENCOUNTER — Other Ambulatory Visit: Payer: Self-pay | Admitting: Otolaryngology

## 2020-08-07 ENCOUNTER — Ambulatory Visit
Admission: RE | Admit: 2020-08-07 | Discharge: 2020-08-07 | Disposition: A | Discharge status: Home / Self Care | Payer: 59 | Source: Ambulatory Visit | Attending: Otolaryngology | Admitting: Otolaryngology

## 2020-08-07 ENCOUNTER — Other Ambulatory Visit: Payer: Self-pay

## 2020-08-07 DIAGNOSIS — M542 Cervicalgia: Secondary | ICD-10-CM | POA: Diagnosis not present

## 2020-08-07 DIAGNOSIS — R5383 Other fatigue: Secondary | ICD-10-CM | POA: Diagnosis not present

## 2020-08-07 DIAGNOSIS — H9209 Otalgia, unspecified ear: Secondary | ICD-10-CM | POA: Diagnosis not present

## 2020-08-07 DIAGNOSIS — R52 Pain, unspecified: Secondary | ICD-10-CM | POA: Insufficient documentation

## 2020-08-07 DIAGNOSIS — R5381 Other malaise: Secondary | ICD-10-CM | POA: Diagnosis not present

## 2020-08-10 ENCOUNTER — Ambulatory Visit: Payer: 59 | Admitting: Cardiovascular Disease

## 2020-08-17 DIAGNOSIS — M542 Cervicalgia: Secondary | ICD-10-CM | POA: Diagnosis not present

## 2020-08-20 ENCOUNTER — Other Ambulatory Visit (HOSPITAL_COMMUNITY): Payer: Self-pay | Admitting: Nurse Practitioner

## 2020-08-20 ENCOUNTER — Other Ambulatory Visit: Payer: Self-pay | Admitting: Nurse Practitioner

## 2020-08-20 DIAGNOSIS — M542 Cervicalgia: Secondary | ICD-10-CM

## 2020-08-30 ENCOUNTER — Ambulatory Visit
Admission: RE | Admit: 2020-08-30 | Discharge: 2020-08-30 | Disposition: A | Discharge status: Home / Self Care | Payer: 59 | Source: Ambulatory Visit | Attending: Nurse Practitioner | Admitting: Nurse Practitioner

## 2020-08-30 ENCOUNTER — Other Ambulatory Visit: Payer: Self-pay

## 2020-08-30 DIAGNOSIS — M542 Cervicalgia: Secondary | ICD-10-CM | POA: Diagnosis not present

## 2020-09-04 NOTE — Progress Notes (Signed)
Cardiology Office Note  Date:  09/05/2020   ID:  Perry Richardson, DOB Nov 03, 1965, MRN [id number]  PCP:  Patient, No Pcp Per   Chief Complaint  Patient presents with  . Follow-up    1 Year follow up and per patient no issues or concerns he would like to discuss at today's visit. Medications verbally reviewed with patient.     HPI:  Perry Richardson is a very pleasant 54 year old gentleman who is a active fireman who presented to [facility] on July 20 2012 with palpitations,  atrial fibrillation,   obstructive sleep apnea with nasal pillow Seen in the emergency room June 2016 with chest discomfort, workup was negative, felt to be GI in nature Last episode of atrial fibrillation September 13, 2015 as far as he is aware He presents today for routine follow-up of atrial fibrillation  LOV with me 07/2016  On simvastatin 10, Labs through city, these have been requested on today's visit  Diverticulosis one year ago Has recovered, no recurrence  No arrhythmia, nothing concerning for atrial fibrillation  Digging a tree stump out, developed severe neck pain, Work-up as below MRI results reviewed 1. Multilevel cervical disc degeneration without significant spinal stenosis. 2. Moderate to severe right neural foraminal stenosis at C3-4 and C5-6. 3. Moderate left neural foraminal stenosis at C6-7.  EKG personally reviewed by myself on todays visit Sinus bradycardia rate 49 bpm no significant ST-T wave changes  Other past medical history reviewed Previous Holter monitor showing rare PVC, APC, short runs of atrial tachycardia Recent chest CT coronary calcium score of 0, very small 2 mm nodules noted Previously was having symptomatic PVCs  Other past medical history reviewed 09/13/2015 went into atrial fibrillation Was running when he felt himself convert Went to the emergency room, confirmed atrial fibrillation Took a bath, converted back to normal sinus rhythm. Reports that his PVCs have been  significantly better on low-dose bystolic, no significant side effects  EKG from the emergency room  rate in the 70s, atrial fib  During his hospital course, it was felt that he had been in atrial fibrillation for at least 24 hours. He had felt some palpitations, shortness of breath with exertion the day prior. No prior history of tachyarrhythmias. He was started on diltiazem infusion and changed to p.o. Cardizem in the morning. he converted to sinus rhythm shortly after. On admission, in the emergency room, he had had a vagal response to a blood draw. Previous Echocardiogram in the hospital was essentially normal with normal LV systolic function  PMH:   has a past medical history of Atrial fibrillation [facility]) (Oct 2014), Diverticulitis, Hyperlipidemia, and Sleep apnea.  PSH:    Past Surgical History:  Procedure Laterality Date  . COLONOSCOPY WITH PROPOFOL N/A 05/30/2015   Procedure: COLONOSCOPY WITH PROPOFOL;  Surgeon: Robert Bellow, MD;  Location: [facility] ENDOSCOPY;  Service: Endoscopy;  Laterality: N/A;  . COLONOSCOPY WITH PROPOFOL N/A 12/28/2019   Procedure: COLONOSCOPY WITH PROPOFOL;  Surgeon: Robert Bellow, MD;  Location: [facility] ENDOSCOPY;  Service: Endoscopy;  Laterality: N/A;  . ESOPHAGOGASTRODUODENOSCOPY N/A 05/30/2015   Procedure: ESOPHAGOGASTRODUODENOSCOPY (EGD);  Surgeon: Robert Bellow, MD;  [facility] ENDOSCOPY;  Service: Endoscopy;  Laterality: N/A;  . MANDIBLE FRACTURE SURGERY    . MOLE REMOVAL    . NASAL SINUS SURGERY     x2  . TONSILLECTOMY      Current Outpatient Medications  Medication Sig Dispense Refill  . AXIRON 30 MG/ACT SOLN as directed.    Marland Kitchen  Cholecalciferol (VITAMIN D) 400 UNITS capsule Take 400 Units by mouth daily.     . Cyanocobalamin (RA VITAMIN B-12 TR) 1000 MCG TBCR Take 1,000 mcg by mouth daily.     Marland Kitchen diltiazem (CARDIZEM) 30 MG tablet Take 1 tablet (30 mg total) by mouth 3 (three) times daily as needed. 90 tablet 3  . finasteride (PROSCAR) 5 MG  tablet Take 5 mg by mouth daily.     . flecainide (TAMBOCOR) 50 MG tablet Take 1 tablet (50 mg total) by mouth 2 (two) times daily as needed. 60 tablet 3  . fluticasone (FLONASE) 50 MCG/ACT nasal spray USE 2 SPRAYS IN EACH NOSTRIL ONCE DAILY AS DIRECTED 48 g 3  . nebivolol (BYSTOLIC) 5 MG tablet Take 1 tablet (5 mg total) by mouth daily as needed. 30 tablet 6  . NON FORMULARY CPAP machine    . simvastatin (ZOCOR) 10 MG tablet Take 1 tablet (10 mg total) by mouth every evening. 90 tablet 2   No current facility-administered medications for this visit.    Allergies:   Patient has no known allergies.   Social History:  The patient  reports that he has never smoked. He has never used smokeless tobacco. He reports current alcohol use. He reports that he does not use drugs.   Family History:   family history includes COPD in his mother; Cancer in his mother; Cancer (age of onset: 6) in an other family member; Colon polyps (age of onset: 26) in his daughter; Hypertension in his father; Prostate cancer in his brother and father.    Review of Systems: Review of Systems  Constitutional: Negative.   Respiratory: Negative.   Cardiovascular: Negative.   Gastrointestinal: Negative.   Musculoskeletal: Negative.   Neurological: Negative.   Psychiatric/Behavioral: Negative.   All other systems reviewed and are negative.    PHYSICAL EXAM: VS:  BP 118/78 (BP Location: Left Arm, Patient Position: Sitting, Cuff Size: Normal)   Pulse (!) 49   Ht 5\' 10"  (1.778 m)   Wt 212 lb (96.2 kg)   SpO2 98%   BMI 30.42 kg/m  , BMI Body mass index is 30.42 kg/m. GEN: Well nourished, well developed, in no acute distress  HEENT: normal  Neck: no JVD, carotid bruits, or masses Cardiac: RRR; no murmurs, rubs, or gallops,no edema  Respiratory:  clear to auscultation bilaterally, normal work of breathing GI: soft, nontender, nondistended, + BS MS: no deformity or atrophy  Skin: warm and dry, no rash Neuro:   Strength and sensation are intact Psych: euthymic mood, full affect    Recent Labs: 10/19/2019: ALT 31; BUN 19; Creatinine, Ser 1.02; Hemoglobin 14.9; Platelets 203; Potassium 4.3; Sodium 140; TSH 1.160    Lipid Panel Lab Results  Component Value Date   CHOL 210 (H) 10/19/2019   HDL 50 10/19/2019   LDLCALC 133 (H) 10/19/2019   TRIG 149 10/19/2019      Wt Readings from Last 3 Encounters:  09/05/20 212 lb (96.2 kg)  12/28/19 206 lb (93.4 kg)  10/25/19 212 lb (96.2 kg)     ASSESSMENT AND PLAN:  Atrial fibrillation, unspecified type ([place]) - Plan: EKG 12-Lead No recent episodes of arrhythmia since December 2016 Intermittent use of nasal CPAP Weight stable Discussed pharmacologic strategies for how to get out of this rhythm on any occurrence Requesting refill of his flecainide diltiazem metoprolol to take as needed for breakthrough atrial fibrillation For atrial fibrillation would take flecainide 50 with diltiazem 30 Recurrent arrhythmia after 1  hour could take additional flecainide with metoprolol tartrate 25  Pure hypercholesterolemia We will stay on simvastatin CT coronary calcium score of 0 Records requested from the city who does his lab work  Ventricular ectopy Denies any tachycardia, near syncope or syncope  OSA (obstructive sleep apnea) Intermittent compliance with nasal CPAP   Total encounter time more than 40 minutes  Greater than 50% was spent in counseling and coordination of care with the patient   No orders of the defined types were placed in this encounter.    Signed, Esmond Plants, M.D., Ph.D. 09/05/2020  [place], [place]

## 2020-09-05 ENCOUNTER — Other Ambulatory Visit: Payer: Self-pay

## 2020-09-05 ENCOUNTER — Ambulatory Visit (INDEPENDENT_AMBULATORY_CARE_PROVIDER_SITE_OTHER): Payer: 59 | Admitting: Cardiovascular Disease

## 2020-09-05 ENCOUNTER — Encounter: Payer: Self-pay | Admitting: Cardiovascular Disease

## 2020-09-05 VITALS — BP 118/78 | HR 49 | Ht 70.0 in | Wt 212.0 lb

## 2020-09-05 DIAGNOSIS — I480 Paroxysmal atrial fibrillation: Secondary | ICD-10-CM | POA: Diagnosis not present

## 2020-09-05 DIAGNOSIS — I493 Ventricular premature depolarization: Secondary | ICD-10-CM | POA: Diagnosis not present

## 2020-09-05 DIAGNOSIS — E782 Mixed hyperlipidemia: Secondary | ICD-10-CM | POA: Diagnosis not present

## 2020-09-05 MED ORDER — FLECAINIDE ACETATE 50 MG PO TABS: 50.0000 mg | ORAL_TABLET | Freq: Two times a day (BID) | ORAL | 3 refills | Status: AC | PRN

## 2020-09-05 MED ORDER — METOPROLOL TARTRATE 25 MG PO TABS: 25.0000 mg | ORAL_TABLET | Freq: Two times a day (BID) | ORAL | 3 refills | Status: AC | PRN

## 2020-09-05 MED ORDER — DILTIAZEM HCL 30 MG PO TABS: 30.0000 mg | ORAL_TABLET | Freq: Three times a day (TID) | ORAL | 3 refills | Status: DC | PRN

## 2020-09-05 NOTE — Patient Instructions (Signed)
Medication Instructions:  No changes  For atrial fibrillation, Take a flecainide and a diltiazem Wait 1-2 hours, If still in atrial fibrillation, Take additional flecainide and a metoprolol   If you need a refill on your cardiac medications before your next appointment, please call your pharmacy.    Lab work: No new labs needed   If you have labs (blood work) drawn today and your tests are completely normal, you will receive your results only by: Marland Kitchen MyChart Message (if you have MyChart) OR . A paper copy in the mail If you have any lab test that is abnormal or we need to change your treatment, we will call you to review the results.   Testing/Procedures: No new testing needed   Follow-Up: At [facility], you and your health needs are our priority.  As part of our continuing mission to provide you with exceptional heart care, we have created designated Provider Care Teams.  These Care Teams include your primary Cardiologist (physician) and Advanced Practice Providers (APPs -  Physician Assistants and Nurse Practitioners) who all work together to provide you with the care you need, when you need it.  . You will need a follow up appointment as needed  . Providers on your designated Care Team:   . Murray Hodgkins, NP . Christell Faith, PA-C . Marrianne Mood, PA-C  Any Other Special Instructions Will Be Listed Below (If Applicable).  COVID-19 Vaccine Information can be found at: ShippingScam.co.uk For questions related to vaccine distribution or appointments, please email [email or web address] .[email or web address] or call 9382181292.

## 2020-09-17 ENCOUNTER — Encounter: Payer: Self-pay | Admitting: Physician Assistant

## 2020-09-17 ENCOUNTER — Ambulatory Visit: Payer: Self-pay | Admitting: Physician Assistant

## 2020-09-17 ENCOUNTER — Other Ambulatory Visit: Payer: Self-pay

## 2020-09-17 VITALS — BP 142/92 | HR 66 | Temp 98.8°F | Resp 14 | Ht 70.0 in | Wt 210.0 lb

## 2020-09-17 DIAGNOSIS — M542 Cervicalgia: Secondary | ICD-10-CM

## 2020-09-17 DIAGNOSIS — Z23 Encounter for immunization: Secondary | ICD-10-CM

## 2020-09-17 NOTE — Progress Notes (Signed)
   Subjective: Neck pain    Patient ID: Perry Richardson, male    DOB: 02/05/1966, 53 y.o.   MRN: [id number]  HPI Patient presents with 51months neck pain which originates on the left lateral neck and radiates to the anterior neck.  Patient has had imaging consistent x-rays MRI of the cervical spine showed only disc disease.  Patient has been evaluated by ENT clinic due to his anterior complaint of neck pain.  No acute findings found at ENT clinic.  Patient also has seen orthopedics and told he had cervical disc disease but does not explain his left lateral neck pain.   Review of Systems    Hyperlipidemia, hypertension, and hypothyroidism. Objective:   Physical Exam  No acute distress.  HEENT is unremarkable.  Neck is supple without adenopathy or bruits.  Lungs are clear to auscultation.      Assessment & Plan:Cervicalgia  Patient with consulting neurology for definitive evaluation and treatment.

## 2020-09-17 NOTE — Progress Notes (Signed)
Pt states his neck has been bothering him for about 4 months. Pt states he was digging a stump when he first felt it.

## 2020-09-18 ENCOUNTER — Other Ambulatory Visit: Payer: Self-pay | Admitting: Physician Assistant

## 2020-09-18 MED ORDER — METHYLPREDNISOLONE 4 MG PO TBPK: ORAL_TABLET | ORAL | 0 refills | Status: DC

## 2020-09-27 ENCOUNTER — Encounter: Payer: Self-pay | Admitting: *Deleted

## 2020-09-27 ENCOUNTER — Other Ambulatory Visit: Payer: Self-pay

## 2020-09-27 DIAGNOSIS — Z5321 Procedure and treatment not carried out due to patient leaving prior to being seen by health care provider: Secondary | ICD-10-CM | POA: Diagnosis not present

## 2020-09-27 DIAGNOSIS — R1030 Lower abdominal pain, unspecified: Secondary | ICD-10-CM | POA: Insufficient documentation

## 2020-09-27 LAB — CBC
HCT: 43.8 % (ref 39.0–52.0)
Hemoglobin: 15.4 g/dL (ref 13.0–17.0)
MCH: 32.4 pg (ref 26.0–34.0)
MCHC: 35.2 g/dL (ref 30.0–36.0)
MCV: 92.2 fL (ref 80.0–100.0)
Platelets: 190 10*3/uL (ref 150–400)
RBC: 4.75 MIL/uL (ref 4.22–5.81)
RDW: 12.5 % (ref 11.5–15.5)
WBC: 10.8 10*3/uL — ABNORMAL HIGH (ref 4.0–10.5)
nRBC: 0 % (ref 0.0–0.2)

## 2020-09-27 LAB — COMPREHENSIVE METABOLIC PANEL
ALT: 26 U/L (ref 0–44)
AST: 18 U/L (ref 15–41)
Albumin: 4 g/dL (ref 3.5–5.0)
Alkaline Phosphatase: 43 U/L (ref 38–126)
Anion gap: 9 (ref 5–15)
BUN: 22 mg/dL — ABNORMAL HIGH (ref 6–20)
CO2: 26 mmol/L (ref 22–32)
Calcium: 9.1 mg/dL (ref 8.9–10.3)
Chloride: 105 mmol/L (ref 98–111)
Creatinine, Ser: 1.06 mg/dL (ref 0.61–1.24)
GFR, Estimated: >60 mL/min (ref >60)
Glucose, Bld: 112 mg/dL — ABNORMAL HIGH (ref 70–99)
Potassium: 4.1 mmol/L (ref 3.5–5.1)
Sodium: 140 mmol/L (ref 135–145)
Total Bilirubin: 0.9 mg/dL (ref 0.3–1.2)
Total Protein: 6.8 g/dL (ref 6.5–8.1)

## 2020-09-27 LAB — LIPASE, BLOOD: Lipase: 38 U/L (ref 11–51)

## 2020-09-27 NOTE — ED Triage Notes (Signed)
Pt reporting lower abd cramping with hx of diverticulitis. Pt has been able to eat and denies vomiting but has had loose stool. Pt is unsure if he noticed small amounts of bright red blood in stool.

## 2020-09-28 ENCOUNTER — Emergency Department
Admission: EM | Admit: 2020-09-28 | Discharge: 2020-09-28 | Disposition: A | Discharge status: Home / Self Care | Payer: 59 | Attending: Emergency Medicine | Admitting: Emergency Medicine

## 2020-09-28 DIAGNOSIS — K5792 Diverticulitis of intestine, part unspecified, without perforation or abscess without bleeding: Secondary | ICD-10-CM | POA: Diagnosis not present

## 2020-09-28 DIAGNOSIS — R1032 Left lower quadrant pain: Secondary | ICD-10-CM | POA: Diagnosis not present

## 2020-10-09 ENCOUNTER — Other Ambulatory Visit: Payer: Self-pay

## 2020-10-09 DIAGNOSIS — Z1152 Encounter for screening for COVID-19: Secondary | ICD-10-CM

## 2020-10-09 LAB — POCT INFLUENZA A/B
Influenza A, POC: NEGATIVE
Influenza B, POC: NEGATIVE

## 2020-10-09 NOTE — Progress Notes (Signed)
Pt wife has flu. Pt requested to get tested for flu and covid. CL,RMA

## 2020-10-10 ENCOUNTER — Encounter: Payer: Self-pay | Admitting: Physician Assistant

## 2020-10-11 ENCOUNTER — Ambulatory Visit: Payer: Self-pay

## 2020-10-11 ENCOUNTER — Other Ambulatory Visit: Payer: Self-pay

## 2020-10-11 DIAGNOSIS — R6889 Other general symptoms and signs: Secondary | ICD-10-CM

## 2020-10-11 DIAGNOSIS — Z20828 Contact with and (suspected) exposure to other viral communicable diseases: Secondary | ICD-10-CM

## 2020-10-11 LAB — POCT INFLUENZA A/B
Influenza A, POC: POSITIVE — AB
Influenza B, POC: NEGATIVE

## 2020-10-11 LAB — NOVEL CORONAVIRUS, NAA: SARS-CoV-2, NAA: NOT DETECTED

## 2020-10-11 LAB — SARS-COV-2, NAA 2 DAY TAT

## 2020-10-11 MED ORDER — OSELTAMIVIR PHOSPHATE 75 MG PO CAPS: 75.0000 mg | ORAL_CAPSULE | Freq: Two times a day (BID) | ORAL | 0 refills | Status: AC

## 2020-10-18 ENCOUNTER — Other Ambulatory Visit: Payer: Self-pay

## 2020-10-18 ENCOUNTER — Encounter: Payer: Self-pay | Admitting: Physician Assistant

## 2020-10-18 ENCOUNTER — Ambulatory Visit: Payer: Self-pay | Admitting: Physician Assistant

## 2020-10-18 VITALS — HR 68 | Temp 98.8°F | Ht 70.0 in | Wt 217.4 lb

## 2020-10-18 DIAGNOSIS — K5732 Diverticulitis of large intestine without perforation or abscess without bleeding: Secondary | ICD-10-CM

## 2020-10-18 MED ORDER — NYSTATIN 100000 UNIT/ML MT SUSP: 5.0000 mL | Freq: Four times a day (QID) | OROMUCOSAL | 0 refills | Status: AC

## 2020-10-18 MED ORDER — VITAMIN E 180 MG (400 UNIT) PO CAPS: 400.0000 [IU] | ORAL_CAPSULE | Freq: Two times a day (BID) | ORAL | 6 refills | Status: AC

## 2020-10-18 MED ORDER — FLUCONAZOLE 150 MG PO TABS: 150.0000 mg | ORAL_TABLET | Freq: Once | ORAL | 1 refills | Status: AC

## 2020-10-18 MED ORDER — VITAMIN D (ERGOCALCIFEROL) 1.25 MG (50000 UNIT) PO CAPS: 50000.0000 [IU] | ORAL_CAPSULE | Freq: Once | ORAL | 0 refills | Status: AC

## 2020-10-18 NOTE — Progress Notes (Signed)
   Subjective: Scratchy throat    Patient ID: Perry Richardson, male    DOB: 02-28-1966, 55 y.o.   MRN: [id number]  HPI 55 year old male who presents with a prior history of diverticulitis November 2020 treated acutely with the Flagyl Cipro repeat treatment occurred in December 2020 treated with same regimen this time developed all symptoms of sore throat with coated tongue pertinent negatives no associated fever no diarrhea  currently    Review of Systems   11/20 diverticulitis with perforation small treated medical medically only medical management colonoscopy 3/21 follow-up procedure unremarkable colon except for left lower quadrant diverticular disease no acute findings stool cultures not been performed  Medications in addition to metoprolol diltiazem and flecainide as recommended by treating cardiologist.  Client is taking a Zeasorb antifungal powder for irritation of the anal region recommended by dermatology   Objective:   Physical Exam physical exam blood pressure 130/98 using small cuff large cuff is nonfunctioning currently t98.8.  HEENT normocephalic atraumatic head eyes PERRLA EOM intact, oral exam reveals coated thick white tongue, tonsillar erythema without localization  Pulmonary exam umremarkable Cardiac exam unremarkable for ectopy regular rate and rhythm Abdominal exam soft bowel sounds are present mild to discomfort to deep abdominal pain in left lower quadrant no organomegaly with evidence use of antifungal powder Extremities no cyanosis Homan negative  Neurologic neurologic exam is grossly intact     Assessment & Plan:  Dr. Fran Lowes exam/summary in light of prior history of diverticular disease will order stool cultures x2 Diflucan 1 tab if refractory sxs repeat in 2 days, nystatin 100,000 units 4 times daily to start after stool cultures acquired today start med in am treatment regimen discussed with patient who understands and agrees

## 2020-10-19 ENCOUNTER — Other Ambulatory Visit: Payer: Self-pay

## 2020-10-19 DIAGNOSIS — E785 Hyperlipidemia, unspecified: Secondary | ICD-10-CM

## 2020-10-19 MED ORDER — SIMVASTATIN 10 MG PO TABS: 10.0000 mg | ORAL_TABLET | Freq: Every evening | ORAL | 2 refills | Status: DC

## 2020-10-24 LAB — STOOL CULTURE: E coli, Shiga toxin Assay: NEGATIVE

## 2020-10-25 DIAGNOSIS — Z01818 Encounter for other preprocedural examination: Secondary | ICD-10-CM

## 2020-10-30 ENCOUNTER — Other Ambulatory Visit: Payer: Self-pay

## 2020-10-30 ENCOUNTER — Ambulatory Visit: Payer: Self-pay

## 2020-10-30 DIAGNOSIS — Z0000 Encounter for general adult medical examination without abnormal findings: Secondary | ICD-10-CM

## 2020-10-30 LAB — POCT URINALYSIS DIPSTICK
Bilirubin, UA: NEGATIVE
Blood, UA: NEGATIVE
Glucose, UA: NEGATIVE
Ketones, UA: NEGATIVE
Leukocytes, UA: NEGATIVE
Nitrite, UA: NEGATIVE
Protein, UA: NEGATIVE
Spec Grav, UA: 1.02 (ref 1.010–1.025)
Urobilinogen, UA: 0.2 E.U./dL
pH, UA: 6.5 (ref 5.0–8.0)

## 2020-10-30 NOTE — Progress Notes (Signed)
Scheduled to complete physical 11/01/20 at 10:15 am.  AMD

## 2020-10-31 LAB — CMP12+LP+TP+TSH+6AC+PSA+CBC…
ALT: 30 IU/L (ref 0–44)
AST: 19 IU/L (ref 0–40)
Albumin/Globulin Ratio: 2.2 (ref 1.2–2.2)
Albumin: 4.2 g/dL (ref 3.8–4.9)
Alkaline Phosphatase: 53 IU/L (ref 44–121)
BUN/Creatinine Ratio: 12 (ref 9–20)
BUN: 13 mg/dL (ref 6–24)
Basophils Absolute: 0.1 10*3/uL (ref 0.0–0.2)
Basos: 1 %
Bilirubin Total: 0.6 mg/dL (ref 0.0–1.2)
Calcium: 8.9 mg/dL (ref 8.7–10.2)
Chloride: 104 mmol/L (ref 96–106)
Chol/HDL Ratio: 4.6 ratio (ref 0.0–5.0)
Cholesterol, Total: 206 mg/dL — ABNORMAL HIGH (ref 100–199)
Creatinine, Ser: 1.08 mg/dL (ref 0.76–1.27)
EOS (ABSOLUTE): 0.3 10*3/uL (ref 0.0–0.4)
Eos: 5 %
Estimated CHD Risk: 0.9 times avg. (ref 0.0–1.0)
Free Thyroxine Index: 1.5 (ref 1.2–4.9)
GFR calc Af Amer: 88 mL/min/{1.73_m2} (ref >59)
GFR calc non Af Amer: 76 mL/min/{1.73_m2} (ref >59)
GGT: 24 IU/L (ref 0–65)
Globulin, Total: 1.9 g/dL (ref 1.5–4.5)
Glucose: 91 mg/dL (ref 65–99)
HDL: 45 mg/dL (ref >39)
Hematocrit: 42.6 % (ref 37.5–51.0)
Hemoglobin: 15.1 g/dL (ref 13.0–17.7)
Immature Grans (Abs): 0 10*3/uL (ref 0.0–0.1)
Immature Granulocytes: 0 %
Iron: 106 ug/dL (ref 38–169)
LDH: 183 IU/L (ref 121–224)
LDL Chol Calc (NIH): 129 mg/dL — ABNORMAL HIGH (ref 0–99)
Lymphocytes Absolute: 1.7 10*3/uL (ref 0.7–3.1)
Lymphs: 29 %
MCH: 32.5 pg (ref 26.6–33.0)
MCHC: 35.4 g/dL (ref 31.5–35.7)
MCV: 92 fL (ref 79–97)
Monocytes Absolute: 0.5 10*3/uL (ref 0.1–0.9)
Monocytes: 8 %
Neutrophils Absolute: 3.2 10*3/uL (ref 1.4–7.0)
Neutrophils: 57 %
Phosphorus: 3.4 mg/dL (ref 2.8–4.1)
Platelets: 204 10*3/uL (ref 150–450)
Potassium: 4.2 mmol/L (ref 3.5–5.2)
Prostate Specific Ag, Serum: 0.5 ng/mL (ref 0.0–4.0)
RBC: 4.65 x10E6/uL (ref 4.14–5.80)
RDW: 13 % (ref 11.6–15.4)
Sodium: 141 mmol/L (ref 134–144)
T3 Uptake Ratio: 27 % (ref 24–39)
T4, Total: 5.4 ug/dL (ref 4.5–12.0)
TSH: 1.1 u[IU]/mL (ref 0.450–4.500)
Total Protein: 6.1 g/dL (ref 6.0–8.5)
Triglycerides: 178 mg/dL — ABNORMAL HIGH (ref 0–149)
Uric Acid: 5.7 mg/dL (ref 3.8–8.4)
VLDL Cholesterol Cal: 32 mg/dL (ref 5–40)
WBC: 5.6 10*3/uL (ref 3.4–10.8)

## 2020-11-01 ENCOUNTER — Encounter: Payer: Self-pay | Admitting: Adult Medicine

## 2020-11-01 ENCOUNTER — Other Ambulatory Visit: Payer: Self-pay

## 2020-11-01 ENCOUNTER — Ambulatory Visit: Payer: Self-pay | Admitting: Adult Medicine

## 2020-11-01 VITALS — BP 130/88 | HR 66 | Temp 98.3°F | Resp 14 | Ht 70.0 in | Wt 210.0 lb

## 2020-11-01 DIAGNOSIS — K224 Dyskinesia of esophagus: Secondary | ICD-10-CM

## 2020-11-01 DIAGNOSIS — Z8371 Family history of colonic polyps: Secondary | ICD-10-CM

## 2020-11-01 DIAGNOSIS — Z0000 Encounter for general adult medical examination without abnormal findings: Secondary | ICD-10-CM

## 2020-11-01 NOTE — Progress Notes (Signed)
   Subjective:    Patient ID: Perry Richardson, male    DOB: [birth year], 55 y.o.   MRN: [id number]  HPI 55y annual exam C/o tightness l side of neck seeing ENT specialist   Review of Systems Cardiovascular and Mediastinum Atrial fibrillation [facility])  Priority: Low  Noted: 07/25/2011    Minna Merritts, MD  Ventricular ectopy  Respiratory Obstructive sleep apnea  Digestive Esophageal spasm controlled  Nervous and Auditory Essential tremor stable  Genitourinary Benign prostatic hyperplasia  Other Memory difficulty  Hyperlipidemia   Low testosterone rx topical testosterone by pmd urology Rosana Hoes prostrate cancer brother father   history of colonic polyps Sh drinks 2 beers/wk, nonsmoker    Objective:   Physical Exam Normotensive O2 sat Heent Leonard at perrla eom full fundi b9 mouth clear no palp thyroi Pulm clear to A&P Cardia nsr, nl response to manuver no murmer ectopy 67m observed Abd soft bs+ nontender no organomegaly Extr    5/5 strength  Skin old torso scars x3 basal cell excision Neuro no focal findings Prostrate exam size 4.5x4 assymetric sl boggy nontender       Able to reach superior surface without nodularity      Assessment & Plan:   1-Very strong fh of prostatic ca, with mets both side family Requested information regarding testosterone vs complimentary choices as tribulus, chyrsin, discussed. Need Urology w/u for file  would like to see utz of prostrate with volume & dimension measurement for baseline confirmation of physical exam. Patient will convey to pmd Uro directly as he get biyearly psa, testo tests  2-Diverticulitis has increased fiber, vitd 50ooo given 1/6 with 3ooo qd  vite 1200iu daily  3-Hx of cardiac ectopy placed on flecainide metoprolol diltiazem stable  Current EkG without acute or chronic changes  4-Report no worsening of memory with ess. Tremor unchanged

## 2020-11-26 DIAGNOSIS — M542 Cervicalgia: Secondary | ICD-10-CM | POA: Diagnosis not present

## 2020-12-03 DIAGNOSIS — E291 Testicular hypofunction: Secondary | ICD-10-CM | POA: Diagnosis not present

## 2020-12-03 DIAGNOSIS — Z79899 Other long term (current) drug therapy: Secondary | ICD-10-CM | POA: Diagnosis not present

## 2020-12-03 DIAGNOSIS — N401 Enlarged prostate with lower urinary tract symptoms: Secondary | ICD-10-CM | POA: Diagnosis not present

## 2020-12-07 ENCOUNTER — Other Ambulatory Visit: Payer: Self-pay | Admitting: Otolaryngology

## 2020-12-07 DIAGNOSIS — M542 Cervicalgia: Secondary | ICD-10-CM

## 2020-12-12 ENCOUNTER — Ambulatory Visit
Admission: RE | Admit: 2020-12-12 | Discharge: 2020-12-12 | Disposition: A | Discharge status: Home / Self Care | Payer: 59 | Source: Ambulatory Visit | Attending: Otolaryngology | Admitting: Otolaryngology

## 2020-12-12 ENCOUNTER — Other Ambulatory Visit: Payer: Self-pay

## 2020-12-12 DIAGNOSIS — R1310 Dysphagia, unspecified: Secondary | ICD-10-CM | POA: Diagnosis not present

## 2020-12-12 DIAGNOSIS — M542 Cervicalgia: Secondary | ICD-10-CM | POA: Insufficient documentation

## 2020-12-17 ENCOUNTER — Other Ambulatory Visit: Payer: Self-pay | Admitting: Surgery

## 2020-12-17 ENCOUNTER — Other Ambulatory Visit: Payer: Self-pay | Admitting: Otolaryngology

## 2020-12-17 DIAGNOSIS — M542 Cervicalgia: Secondary | ICD-10-CM

## 2020-12-20 ENCOUNTER — Ambulatory Visit
Admission: RE | Admit: 2020-12-20 | Discharge: 2020-12-20 | Disposition: A | Discharge status: Home / Self Care | Payer: 59 | Source: Ambulatory Visit | Attending: Otolaryngology | Admitting: Otolaryngology

## 2020-12-20 ENCOUNTER — Other Ambulatory Visit: Payer: Self-pay

## 2020-12-20 DIAGNOSIS — R070 Pain in throat: Secondary | ICD-10-CM | POA: Diagnosis not present

## 2020-12-20 DIAGNOSIS — M542 Cervicalgia: Secondary | ICD-10-CM | POA: Diagnosis not present

## 2020-12-20 DIAGNOSIS — H6501 Acute serous otitis media, right ear: Secondary | ICD-10-CM | POA: Diagnosis not present

## 2020-12-20 MED ORDER — IOHEXOL 300 MG/ML  SOLN
75.0000 mL | Freq: Once | INTRAMUSCULAR | Status: AC | PRN
  Administered 2020-12-20: 75 mL via INTRAVENOUS

## 2021-04-05 ENCOUNTER — Other Ambulatory Visit: Payer: Self-pay | Admitting: Physician Assistant

## 2021-04-05 DIAGNOSIS — E785 Hyperlipidemia, unspecified: Secondary | ICD-10-CM

## 2021-04-11 DIAGNOSIS — R208 Other disturbances of skin sensation: Secondary | ICD-10-CM | POA: Diagnosis not present

## 2021-04-11 DIAGNOSIS — X32XXXA Exposure to sunlight, initial encounter: Secondary | ICD-10-CM | POA: Diagnosis not present

## 2021-04-11 DIAGNOSIS — L820 Inflamed seborrheic keratosis: Secondary | ICD-10-CM | POA: Diagnosis not present

## 2021-04-11 DIAGNOSIS — D2261 Melanocytic nevi of right upper limb, including shoulder: Secondary | ICD-10-CM | POA: Diagnosis not present

## 2021-04-11 DIAGNOSIS — L538 Other specified erythematous conditions: Secondary | ICD-10-CM | POA: Diagnosis not present

## 2021-04-11 DIAGNOSIS — D2262 Melanocytic nevi of left upper limb, including shoulder: Secondary | ICD-10-CM | POA: Diagnosis not present

## 2021-04-11 DIAGNOSIS — D2271 Melanocytic nevi of right lower limb, including hip: Secondary | ICD-10-CM | POA: Diagnosis not present

## 2021-04-11 DIAGNOSIS — L570 Actinic keratosis: Secondary | ICD-10-CM | POA: Diagnosis not present

## 2021-04-11 DIAGNOSIS — Z85828 Personal history of other malignant neoplasm of skin: Secondary | ICD-10-CM | POA: Diagnosis not present

## 2021-04-11 DIAGNOSIS — L298 Other pruritus: Secondary | ICD-10-CM | POA: Diagnosis not present

## 2021-04-16 DIAGNOSIS — K5732 Diverticulitis of large intestine without perforation or abscess without bleeding: Secondary | ICD-10-CM | POA: Diagnosis not present

## 2021-04-16 DIAGNOSIS — R070 Pain in throat: Secondary | ICD-10-CM | POA: Diagnosis not present

## 2021-04-16 DIAGNOSIS — H9202 Otalgia, left ear: Secondary | ICD-10-CM | POA: Diagnosis not present

## 2021-04-16 DIAGNOSIS — G250 Essential tremor: Secondary | ICD-10-CM | POA: Diagnosis not present

## 2021-04-16 DIAGNOSIS — H9313 Tinnitus, bilateral: Secondary | ICD-10-CM | POA: Diagnosis not present

## 2021-04-21 DIAGNOSIS — R1032 Left lower quadrant pain: Secondary | ICD-10-CM | POA: Diagnosis not present

## 2021-04-22 ENCOUNTER — Other Ambulatory Visit: Payer: Self-pay | Admitting: General Surgery

## 2021-04-22 ENCOUNTER — Other Ambulatory Visit: Payer: Self-pay

## 2021-04-22 ENCOUNTER — Ambulatory Visit
Admission: RE | Admit: 2021-04-22 | Discharge: 2021-04-22 | Disposition: A | Discharge status: Home / Self Care | Payer: 59 | Source: Ambulatory Visit | Attending: General Surgery | Admitting: General Surgery

## 2021-04-22 DIAGNOSIS — K5732 Diverticulitis of large intestine without perforation or abscess without bleeding: Secondary | ICD-10-CM | POA: Diagnosis not present

## 2021-04-22 DIAGNOSIS — R109 Unspecified abdominal pain: Secondary | ICD-10-CM | POA: Diagnosis not present

## 2021-04-22 LAB — POCT I-STAT CREATININE: Creatinine, Ser: 1 mg/dL (ref 0.61–1.24)

## 2021-04-22 MED ORDER — IOHEXOL 300 MG/ML  SOLN
100.0000 mL | Freq: Once | INTRAMUSCULAR | Status: AC | PRN
  Administered 2021-04-22: 100 mL via INTRAVENOUS

## 2021-05-09 DIAGNOSIS — K5732 Diverticulitis of large intestine without perforation or abscess without bleeding: Secondary | ICD-10-CM | POA: Diagnosis not present

## 2021-06-03 DIAGNOSIS — N401 Enlarged prostate with lower urinary tract symptoms: Secondary | ICD-10-CM | POA: Diagnosis not present

## 2021-06-03 DIAGNOSIS — E291 Testicular hypofunction: Secondary | ICD-10-CM | POA: Diagnosis not present

## 2021-06-03 DIAGNOSIS — Z79899 Other long term (current) drug therapy: Secondary | ICD-10-CM | POA: Diagnosis not present

## 2021-06-05 DIAGNOSIS — N401 Enlarged prostate with lower urinary tract symptoms: Secondary | ICD-10-CM | POA: Diagnosis not present

## 2021-06-05 DIAGNOSIS — E291 Testicular hypofunction: Secondary | ICD-10-CM | POA: Diagnosis not present

## 2021-06-05 DIAGNOSIS — Z79899 Other long term (current) drug therapy: Secondary | ICD-10-CM | POA: Diagnosis not present

## 2021-07-22 ENCOUNTER — Other Ambulatory Visit: Payer: Self-pay

## 2021-07-22 ENCOUNTER — Other Ambulatory Visit: Payer: Self-pay | Admitting: Physician Assistant

## 2021-07-22 MED ORDER — SIMVASTATIN 20 MG PO TABS: 20.0000 mg | ORAL_TABLET | Freq: Every day | ORAL | 3 refills | Status: DC

## 2021-07-22 NOTE — Telephone Encounter (Signed)
Randy called clinic requesting Rx refill for Simvastatin.  When inputting refill information into Epic, a box opened up where an electronic refill request was in Epic sent electronically by patient's pharmacy.  Re-routed Rx refill request for Simvastatin 10 mg to Randel Pigg, PA-C.  AMD

## 2021-07-25 ENCOUNTER — Other Ambulatory Visit: Payer: Self-pay

## 2021-07-25 DIAGNOSIS — J302 Other seasonal allergic rhinitis: Secondary | ICD-10-CM

## 2021-07-26 MED ORDER — FLUTICASONE PROPIONATE 50 MCG/ACT NA SUSP: 2.0000 | Freq: Every day | NASAL | 3 refills | Status: DC

## 2021-08-22 ENCOUNTER — Other Ambulatory Visit: Payer: Self-pay

## 2021-08-22 ENCOUNTER — Ambulatory Visit: Payer: 59

## 2021-08-22 DIAGNOSIS — Z23 Encounter for immunization: Secondary | ICD-10-CM

## 2021-10-16 ENCOUNTER — Ambulatory Visit: Payer: Self-pay | Admitting: Physician Assistant

## 2021-10-16 ENCOUNTER — Encounter: Payer: Self-pay | Admitting: Physician Assistant

## 2021-10-16 ENCOUNTER — Other Ambulatory Visit: Payer: Self-pay

## 2021-10-16 VITALS — BP 115/79 | HR 67 | Temp 98.7°F | Resp 14 | Ht 70.0 in | Wt 206.0 lb

## 2021-10-16 DIAGNOSIS — J0100 Acute maxillary sinusitis, unspecified: Secondary | ICD-10-CM

## 2021-10-16 DIAGNOSIS — Z1152 Encounter for screening for COVID-19: Secondary | ICD-10-CM

## 2021-10-16 DIAGNOSIS — G4730 Sleep apnea, unspecified: Secondary | ICD-10-CM

## 2021-10-16 DIAGNOSIS — K5730 Diverticulosis of large intestine without perforation or abscess without bleeding: Secondary | ICD-10-CM

## 2021-10-16 LAB — POC COVID19 BINAXNOW: SARS Coronavirus 2 Ag: NEGATIVE

## 2021-10-16 MED ORDER — FEXOFENADINE-PSEUDOEPHED ER 60-120 MG PO TB12: 1.0000 | ORAL_TABLET | Freq: Two times a day (BID) | ORAL | 0 refills | Status: DC

## 2021-10-16 MED ORDER — AMOXICILLIN 875 MG PO TABS: 875.0000 mg | ORAL_TABLET | Freq: Two times a day (BID) | ORAL | 0 refills | Status: AC

## 2021-10-16 NOTE — Progress Notes (Signed)
Pt presents today for rapid covid test: sinus pressure and congestion for a week.  Rapid Negative.

## 2021-10-16 NOTE — Addendum Note (Signed)
Addended by: Aliene Altes on: 10/16/2021 04:44 PM   Modules accepted: Orders

## 2021-10-16 NOTE — Progress Notes (Signed)
° °  Subjective: Sinusitis and sleep apnea    Patient ID: Perry Richardson, male    DOB: 06-Aug-1966, 56 y.o.   MRN: [id number]  HPI Patient presents with 10 days of sinus congestion which is developed for thick greenish nasal discharge 2 days ago.  Patient denies recent travel or known contact COVID-19.  Patient tested negative for COVID-19 and influenza today.  Patient also states he would like reevaluation of sleep apnea.  Patient was diagnosed and treated with very CPAP in 2014.  Patient stated no follow-up status post receiving CPAP machine.  States no one ever called went to bring the sims card for reading.  Admits to periods of noncompliance.  Patient also requested reevaluation via gastroenterologist for diverticulosis.  Patient was seen 6 months ago by his surgeon who did not recommend surgery.  Patient said he continues to have intermittent episodes requiring usage of Metamucil and dietary changes.  Review of Systems A. fib, OSA, and seasonal rhinitis.    Objective:   Physical Exam No acute distress.  Temperature 98.7, pulse 67, respiration 14, BP is 157/79, patient 96% O2 sat on room air.  Patient weighs 206 pounds and BMI is 29.56. HEENT is remarkable for edematous nasal turbinates, copious postnasal drainage, and bilateral maxillary guarding. Neck is supple  without lymphadenopathy or bruits.  Lungs are clear to auscultation.  Heart is regular rate and rhythm. Abdomen is negative HSM, normoactive bowel sounds, soft nontender to palpation.       Assessment & Plan: Sinusitis, sleep apnea, and diverticulosis.   Patient get a prescription for amoxicillin and Allegra-D.  Patient advised to to schedule full physical exam at which time if appropriate consults for obstructive sleep apnea and diverticulosis will be generated.

## 2021-10-16 NOTE — Progress Notes (Signed)
Pt has been having sinus issues for about a week. Rapid Covid is negative.  Pt wants has questions about his cpap machine.

## 2021-10-31 ENCOUNTER — Ambulatory Visit: Payer: Self-pay

## 2021-10-31 ENCOUNTER — Other Ambulatory Visit: Payer: Self-pay

## 2021-10-31 DIAGNOSIS — Z0000 Encounter for general adult medical examination without abnormal findings: Secondary | ICD-10-CM

## 2021-10-31 LAB — POCT URINALYSIS DIPSTICK
Bilirubin, UA: NEGATIVE
Blood, UA: NEGATIVE
Glucose, UA: NEGATIVE
Ketones, UA: NEGATIVE
Leukocytes, UA: NEGATIVE
Nitrite, UA: NEGATIVE
Protein, UA: POSITIVE — AB
Spec Grav, UA: 1.015 (ref 1.010–1.025)
Urobilinogen, UA: 0.2 E.U./dL
pH, UA: 7.5 (ref 5.0–8.0)

## 2021-10-31 NOTE — Progress Notes (Signed)
11/07/21 annual physical scheduled

## 2021-11-01 LAB — CMP12+LP+TP+TSH+6AC+PSA+CBC…
ALT: 32 IU/L (ref 0–44)
AST: 27 IU/L (ref 0–40)
Albumin/Globulin Ratio: 2.4 — ABNORMAL HIGH (ref 1.2–2.2)
Albumin: 4.5 g/dL (ref 3.8–4.9)
Alkaline Phosphatase: 58 IU/L (ref 44–121)
BUN/Creatinine Ratio: 21 — ABNORMAL HIGH (ref 9–20)
BUN: 20 mg/dL (ref 6–24)
Basophils Absolute: 0.1 10*3/uL (ref 0.0–0.2)
Basos: 1 %
Bilirubin Total: 0.5 mg/dL (ref 0.0–1.2)
Calcium: 9.2 mg/dL (ref 8.7–10.2)
Chloride: 101 mmol/L (ref 96–106)
Chol/HDL Ratio: 3.7 ratio (ref 0.0–5.0)
Cholesterol, Total: 190 mg/dL (ref 100–199)
Creatinine, Ser: 0.97 mg/dL (ref 0.76–1.27)
EOS (ABSOLUTE): 0.3 10*3/uL (ref 0.0–0.4)
Eos: 5 %
Estimated CHD Risk: 0.6 times avg. (ref 0.0–1.0)
Free Thyroxine Index: 1.4 (ref 1.2–4.9)
GGT: 15 IU/L (ref 0–65)
Globulin, Total: 1.9 g/dL (ref 1.5–4.5)
Glucose: 87 mg/dL (ref 70–99)
HDL: 51 mg/dL (ref >39)
Hematocrit: 45.3 % (ref 37.5–51.0)
Hemoglobin: 15.6 g/dL (ref 13.0–17.7)
Immature Grans (Abs): 0 10*3/uL (ref 0.0–0.1)
Immature Granulocytes: 0 %
Iron: 90 ug/dL (ref 38–169)
LDH: 192 IU/L (ref 121–224)
LDL Chol Calc (NIH): 116 mg/dL — ABNORMAL HIGH (ref 0–99)
Lymphocytes Absolute: 1.3 10*3/uL (ref 0.7–3.1)
Lymphs: 25 %
MCH: 31.3 pg (ref 26.6–33.0)
MCHC: 34.4 g/dL (ref 31.5–35.7)
MCV: 91 fL (ref 79–97)
Monocytes Absolute: 0.3 10*3/uL (ref 0.1–0.9)
Monocytes: 6 %
Neutrophils Absolute: 3.3 10*3/uL (ref 1.4–7.0)
Neutrophils: 63 %
Phosphorus: 3.3 mg/dL (ref 2.8–4.1)
Platelets: 244 10*3/uL (ref 150–450)
Potassium: 4.2 mmol/L (ref 3.5–5.2)
Prostate Specific Ag, Serum: 1.3 ng/mL (ref 0.0–4.0)
RBC: 4.99 x10E6/uL (ref 4.14–5.80)
RDW: 12.3 % (ref 11.6–15.4)
Sodium: 139 mmol/L (ref 134–144)
T3 Uptake Ratio: 26 % (ref 24–39)
T4, Total: 5.5 ug/dL (ref 4.5–12.0)
TSH: 1.33 u[IU]/mL (ref 0.450–4.500)
Total Protein: 6.4 g/dL (ref 6.0–8.5)
Triglycerides: 130 mg/dL (ref 0–149)
Uric Acid: 5.5 mg/dL (ref 3.8–8.4)
VLDL Cholesterol Cal: 23 mg/dL (ref 5–40)
WBC: 5.2 10*3/uL (ref 3.4–10.8)
eGFR: 91 mL/min/{1.73_m2} (ref >59)

## 2021-11-07 ENCOUNTER — Ambulatory Visit: Payer: Self-pay | Admitting: Physician Assistant

## 2021-11-07 ENCOUNTER — Encounter: Payer: Self-pay | Admitting: Physician Assistant

## 2021-11-07 ENCOUNTER — Other Ambulatory Visit: Payer: Self-pay

## 2021-11-07 VITALS — BP 119/87 | HR 52 | Temp 97.8°F | Resp 14 | Ht 70.0 in | Wt 204.0 lb

## 2021-11-07 DIAGNOSIS — Z0000 Encounter for general adult medical examination without abnormal findings: Secondary | ICD-10-CM

## 2021-11-07 DIAGNOSIS — K5732 Diverticulitis of large intestine without perforation or abscess without bleeding: Secondary | ICD-10-CM

## 2021-11-07 NOTE — Progress Notes (Signed)
Pt denies any issues or concerns at this time/CL,RMA

## 2021-11-07 NOTE — Progress Notes (Signed)
[facility]    ____________________________________________   None    (approximate)  I have reviewed the triage vital signs and the nursing notes.   HISTORY  Chief Complaint Annual Exam    HPI Perry Richardson is a 56 y.o. male patient presents for annual physical exam voices concern for left lower abdominal pain secondary to diverticulosis.  Patient diagnosed over 2 years ago with intermittent episodes and has not has seen a gastroenterologist.  No pain at this time         Past Medical History:  Diagnosis Date   Atrial fibrillation [facility]) Oct 2014   one episode   Benign prostatic hyperplasia 07/31/2020   Diverticulitis    Hyperlipidemia    Sleep apnea    CPAP    Patient Active Problem List   Diagnosis Date Noted   Seasonal rhinitis 07/25/2021   Benign prostatic hyperplasia 07/31/2020   Low testosterone 07/31/2020   Ventricular ectopy 08/31/2015   Family history of colonic polyps 04/05/2015   Esophageal spasm 04/05/2015   Essential tremor 08/14/2014   Memory difficulty 08/14/2014   Atrial fibrillation ([place]) 07/25/2011   Obstructive sleep apnea 07/25/2011   Hyperlipidemia 07/25/2011    Past Surgical History:  Procedure Laterality Date   COLONOSCOPY WITH PROPOFOL N/A 05/30/2015   Procedure: COLONOSCOPY WITH PROPOFOL;  Surgeon: Robert Bellow, MD;  Location[facility] ENDOSCOPY;  Service: Endoscopy;  Laterality: N/A;   COLONOSCOPY WITH PROPOFOL N/A 12/28/2019   Procedure: COLONOSCOPY WITH PROPOFOL;  Surgeon: Robert Bellow, MD;  Location: [facility] ENDOSCOPY;  Service: Endoscopy;  Laterality: N/A;   ESOPHAGOGASTRODUODENOSCOPY N/A 05/30/2015   Procedure: ESOPHAGOGASTRODUODENOSCOPY (EGD);  Surgeon: Robert Bellow, MD;  Location: [facility] ENDOSCOPY;  Service: Endoscopy;  Laterality: N/A;   MANDIBLE FRACTURE SURGERY     MOLE REMOVAL     NASAL SINUS SURGERY     x2   TONSILLECTOMY      Prior to Admission medications   Medication Sig Start Date End  Date Taking? Authorizing Provider  Cyanocobalamin 1000 MCG TBCR Take 1,000 mcg by mouth daily.    Yes [provider]  fexofenadine-pseudoephedrine (ALLEGRA-D) 60-120 MG 12 hr tablet Take 1 tablet by mouth 2 (two) times daily. 10/16/21  Yes Sable Feil, PA-C  finasteride (PROSCAR) 5 MG tablet Take 5 mg by mouth daily.  06/17/13  Yes [provider]  fluticasone (FLONASE) 50 MCG/ACT nasal spray Place 2 sprays into both nostrils daily. 07/26/21  Yes Sable Feil, PA-C  NON FORMULARY CPAP machine   Yes [provider]  simvastatin (ZOCOR) 20 MG tablet Take 1 tablet (20 mg total) by mouth at bedtime. 07/22/21  Yes Sable Feil, PA-C  Vitamin D, Ergocalciferol, (DRISDOL) 1.25 MG (50000 UNIT) CAPS capsule Take 50,000 Units by mouth once. 10/18/20  Yes [provider]  vitamin E (VITAMIN E) 180 MG (400 UNITS) capsule Take 1 capsule (400 Units total) by mouth in the morning and at bedtime. 10/18/20  Yes Sable Feil, PA-C  AXIRON 30 MG/ACT SOLN as directed. Patient not taking: Reported on 10/16/2021 07/10/11   [provider]  flecainide (TAMBOCOR) 50 MG tablet Take 1 tablet (50 mg total) by mouth 2 (two) times daily as needed (for tachycardia). Patient not taking: Reported on 10/16/2021 09/05/20   Minna Merritts, MD  metoprolol tartrate (LOPRESSOR) 25 MG tablet Take 1 tablet (25 mg total) by mouth 2 (two) times daily as needed (for tachycardia). Patient not taking: Reported on 10/16/2021 09/05/20  Minna Merritts, MD    Allergies Patient has no known allergies.  Family History  Problem Relation Age of Onset   COPD Mother    Cancer Mother        kidney   Hypertension Father    Prostate cancer Father    Cancer Other 25       niece/rectal cancer   Colon polyps Daughter 11       colon polyp   Prostate cancer Brother     Social History Social History   Tobacco Use   Smoking status: Never   Smokeless tobacco: Never  Substance Use Topics    Alcohol use: Yes    Alcohol/week: 0.0 standard drinks    Comment: rare, 2/week   Drug use: No    Review of Systems  Constitutional: No fever/chills Eyes: No visual changes. ENT: No sore throat. Cardiovascular: Denies chest pain.  History of  A. fib. Respiratory: Denies shortness of breath. Gastrointestinal: No abdominal pain.  No nausea, no vomiting.  No diarrhea.  No constipation.  History of diverticulosis Genitourinary: Negative for dysuria. Musculoskeletal: Negative for back pain. Skin: Negative for rash. Neurological: Negative for headaches, focal weakness or numbness.  Mild upper extremity tremors.  Sleep apnea  Endocrine: Hyperlipidemia  ____________________________________________   PHYSICAL EXAM:  VITAL SIGNS: Temperature 97.8, pulse 52, respiration 14, BP is 119/87 patient 99% O2 sat on room air.  Patient weighs 204 pounds BMI is 29.27. Constitutional: Alert and oriented. Well appearing and in no acute distress. Eyes: Conjunctivae are normal. PERRL. EOMI. Head: Atraumatic. Nose: No congestion/rhinnorhea. Mouth/Throat: Mucous membranes are moist.  Oropharynx non-erythematous. Neck: No stridor.  No cervical spine tenderness to palpation. Hematological/Lymphatic/Immunilogical: No cervical lymphadenopathy. Cardiovascular: Normal rate, regular rhythm. Grossly normal heart sounds.  Good peripheral circulation. Respiratory: Normal respiratory effort.  No retractions. Lungs CTAB. Gastrointestinal: Soft and nontender. No distention. Genitourinary: Deferred since he is followed by neurologist urologist Musculoskeletal: No lower extremity tenderness nor edema.  No joint effusions. Neurologic:  Normal speech and language. No gross focal neurologic deficits are appreciated. No gait instability.  Upper extremity tremors. Skin:  Skin is warm, dry and intact. No rash noted. Psychiatric: Mood and affect are normal. Speech and behavior are  normal.  ____________________________________________   LABS ____  __            Component Ref Range & Units 7 d ago (10/31/21) 6 mo ago (04/22/21) 1 yr ago (10/30/20) 1 yr ago (09/27/20) 1 yr ago (09/27/20) 2 yr ago (10/19/19) 4 yr ago (01/28/17) 4 yr ago (01/28/17)  Glucose 70 - 99 mg/dL 87   91 R  112 High  CM   91 R  127 High  R    Uric Acid 3.8 - 8.4 mg/dL 5.5   5.7 CM    6.1 CM     Comment:            Therapeutic target for gout patients: <6.0  BUN 6 - 24 mg/dL '20   13  22 ' High  R   19  18 R    Creatinine, Ser 0.76 - 1.27 mg/dL 0.97  1.00 R  1.08  1.06 R   1.02  1.12 R    eGFR >59 mL/min/1.73 91          BUN/Creatinine Ratio 9 - 20 21 High    12    19     Sodium 134 - 144 mmol/L 139   141  140 R  140  139 R    Potassium 3.5 - 5.2 mmol/L 4.2   4.2  4.1 R   4.3  3.2 Low  R    Chloride 96 - 106 mmol/L 101   104  105 R   103  107 R    Calcium 8.7 - 10.2 mg/dL 9.2   8.9  9.1 R   9.3  8.8 Low  R    Phosphorus 2.8 - 4.1 mg/dL 3.3   3.4    3.6     Total Protein 6.0 - 8.5 g/dL 6.4   6.1  6.8 R   5.9 Low      Albumin 3.8 - 4.9 g/dL 4.5   4.2  4.0 R   4.2     Globulin, Total 1.5 - 4.5 g/dL 1.9   1.9    1.7     Albumin/Globulin Ratio 1.2 - 2.2 2.4 High    2.2    2.5 High      Bilirubin Total 0.0 - 1.2 mg/dL 0.5   0.6  0.9 R   0.5     Alkaline Phosphatase 44 - 121 IU/L 58   53  43 R   53 R     LDH 121 - 224 IU/L 192   183    170     AST 0 - 40 IU/L '27   19  18 ' R   20     ALT 0 - 44 IU/L 32   30  26 R   31     GGT 0 - 65 IU/L '15   24    18     ' Iron 38 - 169 ug/dL 90   106    141     Cholesterol, Total 100 - 199 mg/dL 190   206 High     210 High      Triglycerides 0 - 149 mg/dL 130   178 High     149     HDL >39 mg/dL 51   45    50     VLDL Cholesterol Cal 5 - 40 mg/dL 23   32    27     LDL Chol Calc (NIH) 0 - 99 mg/dL 116 High    129 High     133 High      Chol/HDL Ratio 0.0 - 5.0 ratio 3.7   4.6 CM    4.2 CM     Comment:                                   T. Chol/HDL Ratio                                               Men  Women                                1/2 Avg.Risk  3.4    3.3                                    Avg.Risk  5.0    4.4  2X Avg.Risk  9.6    7.1                                 3X Avg.Risk 23.4   11.0   Estimated CHD Risk 0.0 - 1.0 times avg. 0.6   0.9 CM    0.8 CM     Comment: The CHD Risk is based on the T. Chol/HDL ratio. Other  factors affect CHD Risk such as hypertension, smoking,  diabetes, severe obesity, and family history of  premature CHD.   TSH 0.450 - 4.500 uIU/mL 1.330   1.100    1.160     T4, Total 4.5 - 12.0 ug/dL 5.5   5.4    4.9     T3 Uptake Ratio 24 - 39 % '26   27    25     ' Free Thyroxine Index 1.2 - 4.9 1.4   1.5    1.2     Prostate Specific Ag, Serum 0.0 - 4.0 ng/mL 1.3   0.5 CM    0.7 CM     Comment: Roche ECLIA methodology.  According to the American Urological Association, Serum PSA should  decrease and remain at undetectable levels after radical  prostatectomy. The AUA defines biochemical recurrence as an initial  PSA value 0.2 ng/mL or greater followed by a subsequent confirmatory  PSA value 0.2 ng/mL or greater.  Values obtained with different assay methods or kits cannot be used  interchangeably. Results cannot be interpreted as absolute evidence  of the presence or absence of malignant disease.   WBC 3.4 - 10.8 x10E3/uL 5.2   5.6   10.8 High  R  5.5   6.6 R   RBC 4.14 - 5.80 x10E6/uL 4.99   4.65   4.75 R  4.83   4.52 R   Hemoglobin 13.0 - 17.7 g/dL 15.6   15.1   15.4 R  14.9   14.6 R   Hematocrit 37.5 - 51.0 % 45.3   42.6   43.8 R  42.6   41.4 R   MCV 79 - 97 fL 91   92   92.2 R  88   91.7 R   MCH 26.6 - 33.0 pg 31.3   32.5   32.4 R  30.8   32.3 R   MCHC 31.5 - 35.7 g/dL 34.4   35.4   35.2 R  35.0   35.2 R   RDW 11.6 - 15.4 % 12.3   13.0   12.5 R  13.3   12.9 R   Platelets 150 - 450 x10E3/uL 244   204   190 R  203   217 R   Neutrophils Not Estab. % 63   57    61   62 R   Lymphs Not  Estab. % '25   29    27     ' Monocytes Not Estab. % '6   8    7     ' Eos Not Estab. % '5   5    4     ' Basos Not Estab. % '1   1    1     ' Neutrophils Absolute 1.4 - 7.0 x10E3/uL 3.3   3.2    3.3   4.1 R   Lymphocytes Absolute 0.7 - 3.1 x10E3/uL 1.3   1.7    1.5   1.7 R   Monocytes Absolute 0.1 -  0.9 x10E3/uL 0.3   0.5    0.4     EOS (ABSOLUTE) 0.0 - 0.4 x10E3/uL 0.3   0.3    0.2     Basophils Absolute 0.0 - 0.2 x10E3/uL 0.1   0.1    0.1   0.0 R   Immature Granulocytes Not Estab. % 0   0    0     Immature Grans              ____________________________________  EKG  Sinus  Bradycardia at 58 bpm. -RSR(V1) -nondiagnostic.   PROBABLY NORMAL ____________________________________________    ____________________________________________   INITIAL IMPRESSION / ASSESSMENT AND PLAN   As part of my medical decision making, I reviewed the following data within the [place]  Well exam.  Discussed lab results with patient.  We will consult to gastroenterologist for definitive evaluation and treatment of diverticulosis.           ____________________________________________     ED Discharge Orders     None        Note:  This document was prepared using Dragon voice recognition software and may include unintentional dictation errors.

## 2021-11-07 NOTE — Addendum Note (Signed)
Addended by: Aliene Altes on: 11/07/2021 09:14 AM   Modules accepted: Orders

## 2021-12-03 DIAGNOSIS — N401 Enlarged prostate with lower urinary tract symptoms: Secondary | ICD-10-CM | POA: Diagnosis not present

## 2021-12-03 DIAGNOSIS — E291 Testicular hypofunction: Secondary | ICD-10-CM | POA: Diagnosis not present

## 2021-12-03 DIAGNOSIS — Z79899 Other long term (current) drug therapy: Secondary | ICD-10-CM | POA: Diagnosis not present

## 2021-12-24 DIAGNOSIS — Z8719 Personal history of other diseases of the digestive system: Secondary | ICD-10-CM | POA: Diagnosis not present

## 2021-12-24 DIAGNOSIS — Z86010 Personal history of colonic polyps: Secondary | ICD-10-CM | POA: Diagnosis not present

## 2021-12-24 DIAGNOSIS — K5732 Diverticulitis of large intestine without perforation or abscess without bleeding: Secondary | ICD-10-CM | POA: Diagnosis not present

## 2021-12-31 ENCOUNTER — Other Ambulatory Visit: Payer: Self-pay | Admitting: Gastroenterology

## 2021-12-31 DIAGNOSIS — Z8719 Personal history of other diseases of the digestive system: Secondary | ICD-10-CM

## 2021-12-31 DIAGNOSIS — R1032 Left lower quadrant pain: Secondary | ICD-10-CM

## 2021-12-31 DIAGNOSIS — K5732 Diverticulitis of large intestine without perforation or abscess without bleeding: Secondary | ICD-10-CM

## 2022-01-03 ENCOUNTER — Other Ambulatory Visit: Payer: Self-pay

## 2022-01-03 ENCOUNTER — Ambulatory Visit
Admission: RE | Admit: 2022-01-03 | Discharge: 2022-01-03 | Disposition: A | Discharge status: Home / Self Care | Payer: 59 | Source: Ambulatory Visit | Attending: Gastroenterology | Admitting: Gastroenterology

## 2022-01-03 DIAGNOSIS — Z8719 Personal history of other diseases of the digestive system: Secondary | ICD-10-CM | POA: Insufficient documentation

## 2022-01-03 DIAGNOSIS — K5732 Diverticulitis of large intestine without perforation or abscess without bleeding: Secondary | ICD-10-CM | POA: Diagnosis not present

## 2022-01-03 DIAGNOSIS — R109 Unspecified abdominal pain: Secondary | ICD-10-CM | POA: Diagnosis not present

## 2022-01-03 DIAGNOSIS — R1032 Left lower quadrant pain: Secondary | ICD-10-CM | POA: Diagnosis not present

## 2022-01-03 MED ORDER — IOHEXOL 300 MG/ML  SOLN
100.0000 mL | Freq: Once | INTRAMUSCULAR | Status: AC | PRN
  Administered 2022-01-03: 100 mL via INTRAVENOUS

## 2022-02-18 ENCOUNTER — Other Ambulatory Visit: Payer: Self-pay | Admitting: Gastroenterology

## 2022-02-18 DIAGNOSIS — R509 Fever, unspecified: Secondary | ICD-10-CM | POA: Diagnosis not present

## 2022-02-18 DIAGNOSIS — R1032 Left lower quadrant pain: Secondary | ICD-10-CM

## 2022-02-18 DIAGNOSIS — Z8719 Personal history of other diseases of the digestive system: Secondary | ICD-10-CM

## 2022-02-18 DIAGNOSIS — R195 Other fecal abnormalities: Secondary | ICD-10-CM | POA: Diagnosis not present

## 2022-02-18 DIAGNOSIS — Z86010 Personal history of colonic polyps: Secondary | ICD-10-CM | POA: Diagnosis not present

## 2022-02-19 ENCOUNTER — Ambulatory Visit
Admission: RE | Admit: 2022-02-19 | Discharge: 2022-02-19 | Disposition: A | Discharge status: Home / Self Care | Payer: 59 | Source: Ambulatory Visit | Attending: Gastroenterology | Admitting: Gastroenterology

## 2022-02-19 DIAGNOSIS — Z8719 Personal history of other diseases of the digestive system: Secondary | ICD-10-CM | POA: Insufficient documentation

## 2022-02-19 DIAGNOSIS — R509 Fever, unspecified: Secondary | ICD-10-CM | POA: Insufficient documentation

## 2022-02-19 DIAGNOSIS — R109 Unspecified abdominal pain: Secondary | ICD-10-CM | POA: Diagnosis not present

## 2022-02-19 DIAGNOSIS — R1032 Left lower quadrant pain: Secondary | ICD-10-CM | POA: Insufficient documentation

## 2022-02-19 MED ORDER — IOHEXOL 300 MG/ML  SOLN
100.0000 mL | Freq: Once | INTRAMUSCULAR | Status: AC | PRN
  Administered 2022-02-19: 100 mL via INTRAVENOUS

## 2022-03-04 DIAGNOSIS — K5732 Diverticulitis of large intestine without perforation or abscess without bleeding: Secondary | ICD-10-CM | POA: Diagnosis not present

## 2022-04-16 DIAGNOSIS — L570 Actinic keratosis: Secondary | ICD-10-CM | POA: Diagnosis not present

## 2022-04-16 DIAGNOSIS — D225 Melanocytic nevi of trunk: Secondary | ICD-10-CM | POA: Diagnosis not present

## 2022-04-16 DIAGNOSIS — D2262 Melanocytic nevi of left upper limb, including shoulder: Secondary | ICD-10-CM | POA: Diagnosis not present

## 2022-04-16 DIAGNOSIS — D2261 Melanocytic nevi of right upper limb, including shoulder: Secondary | ICD-10-CM | POA: Diagnosis not present

## 2022-04-16 DIAGNOSIS — D2272 Melanocytic nevi of left lower limb, including hip: Secondary | ICD-10-CM | POA: Diagnosis not present

## 2022-04-16 DIAGNOSIS — Z85828 Personal history of other malignant neoplasm of skin: Secondary | ICD-10-CM | POA: Diagnosis not present

## 2022-05-26 DIAGNOSIS — E291 Testicular hypofunction: Secondary | ICD-10-CM | POA: Diagnosis not present

## 2022-05-26 DIAGNOSIS — Z79899 Other long term (current) drug therapy: Secondary | ICD-10-CM | POA: Diagnosis not present

## 2022-05-26 DIAGNOSIS — N401 Enlarged prostate with lower urinary tract symptoms: Secondary | ICD-10-CM | POA: Diagnosis not present

## 2022-07-10 DIAGNOSIS — H524 Presbyopia: Secondary | ICD-10-CM | POA: Diagnosis not present

## 2022-07-16 ENCOUNTER — Other Ambulatory Visit: Payer: Self-pay | Admitting: Physician Assistant

## 2022-07-16 DIAGNOSIS — J302 Other seasonal allergic rhinitis: Secondary | ICD-10-CM

## 2022-07-20 IMAGING — MR MR CERVICAL SPINE W/O CM
5 series · 40 of 48 positions shown · non-contrast
Comparison: Cervical spine radiographs 08/07/2020

CLINICAL DATA: Anterior neck pain for 6 weeks.

EXAM:
MRI CERVICAL SPINE WITHOUT CONTRAST
TECHNIQUE: Multiplanar, multisequence MR imaging of the cervical spine was
performed. No intravenous contrast was administered.

[Series 2: T2 · sagittal · 3.0mm · 0.69mm/px · 6 of 13 slices shown (1 of 2)]
[im 1/13]
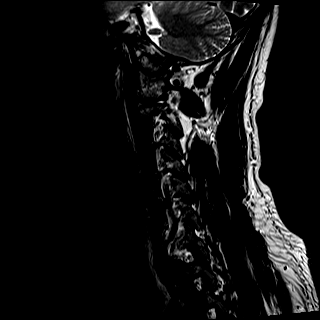
[im 3/13]
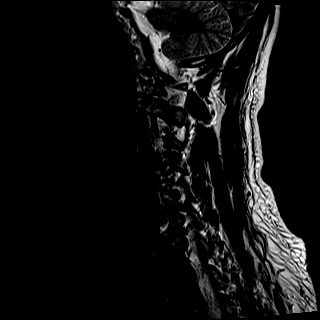
[im 5/13]
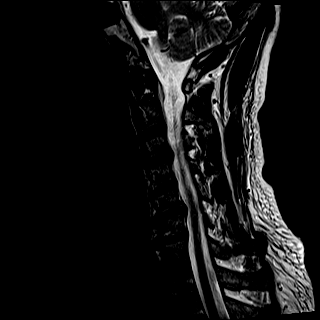
[im 8/13]
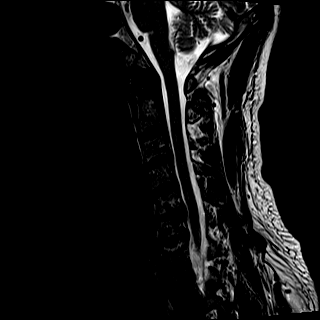
[im 10/13]
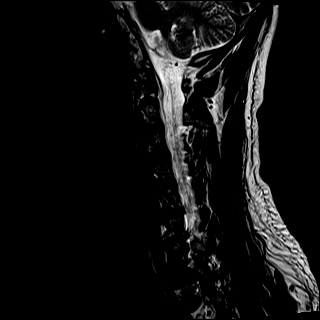
[im 13/13]
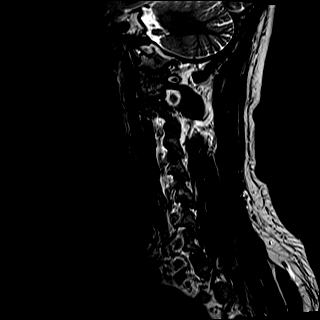

[Series 3: T1 · sagittal · 3.0mm · 0.86mm/px · 6 of 13 slices shown]
[im 1/13]
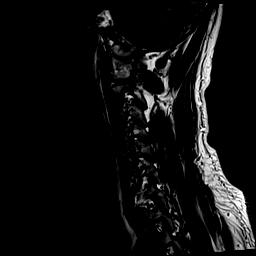
[im 3/13]
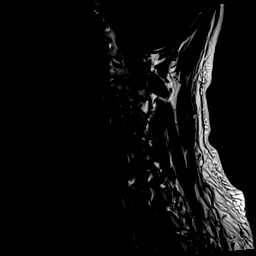
[im 5/13]
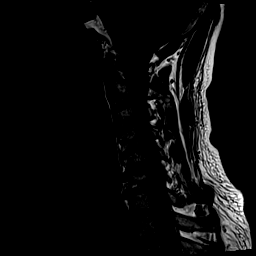
[im 8/13]
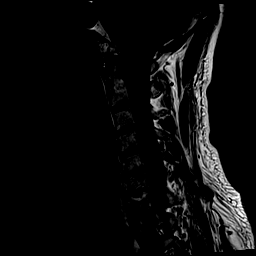
[im 10/13]
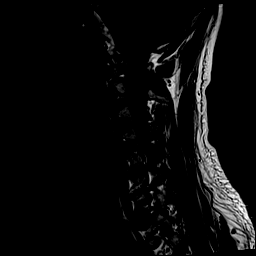
[im 13/13]
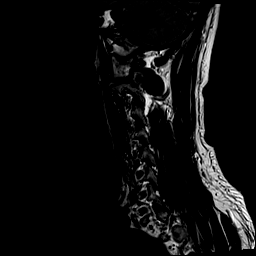

[Series 4: STIR · sagittal · 3.0mm · 0.69mm/px · 6 of 12 slices shown]
[im 1/12]
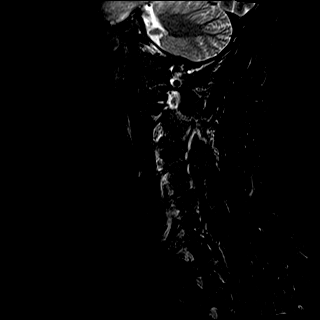
[im 3/12]
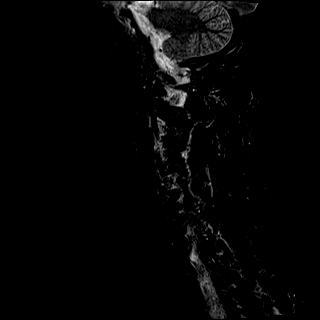
[im 5/12]
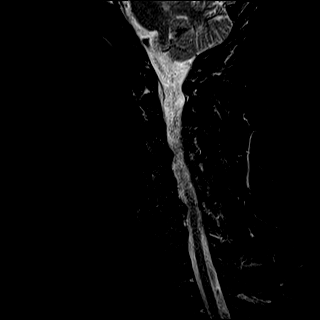
[im 7/12]
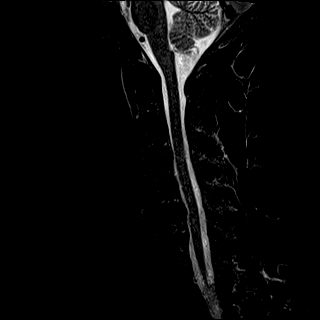
[im 9/12]
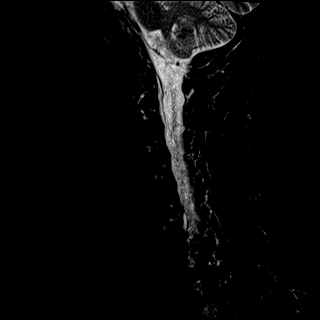
[im 12/12]
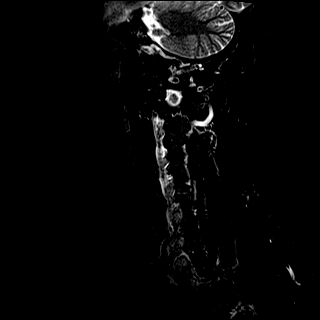

[Series 5: T2 · axial · 3.0mm · 0.62mm/px · z∈[-88,+20]mm · 14 of 30 slices shown (2 of 2)]
[im 1/30]
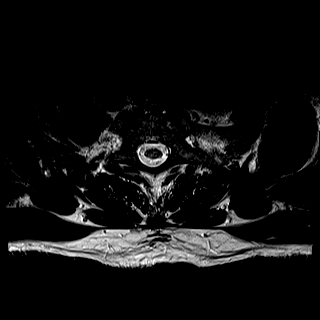
[im 3/30]
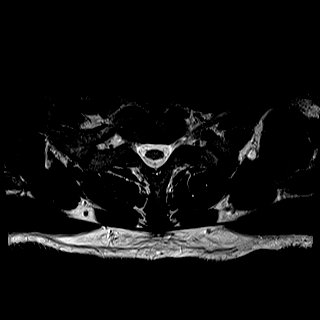
[im 5/30]
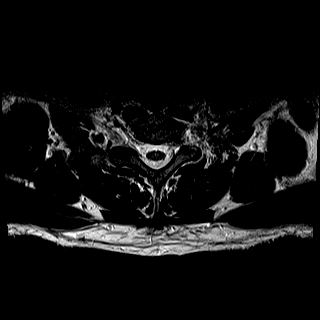
[im 7/30]
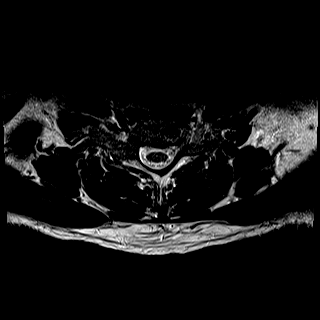
[im 9/30]
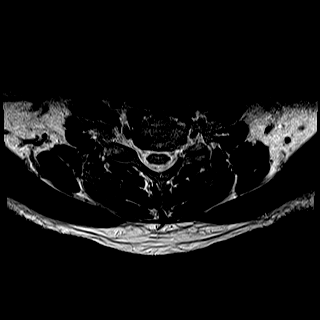
[im 11/30]
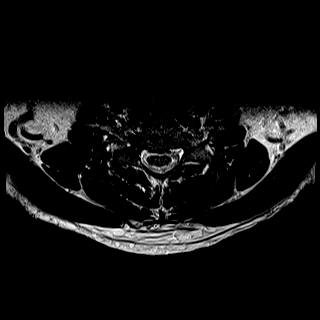
[im 13/30]
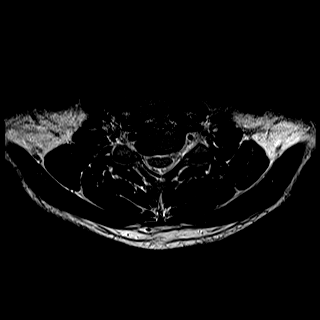
[im 15/30]
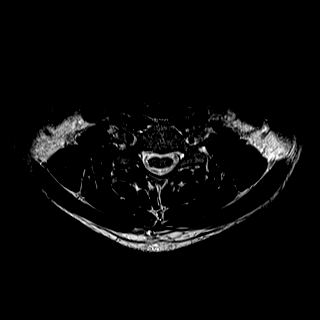
[im 17/30]
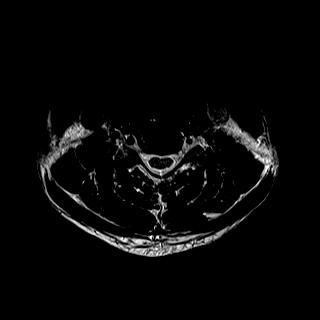
[im 19/30]
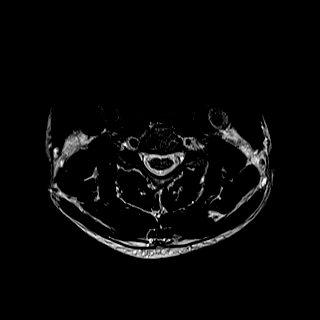
[im 21/30]
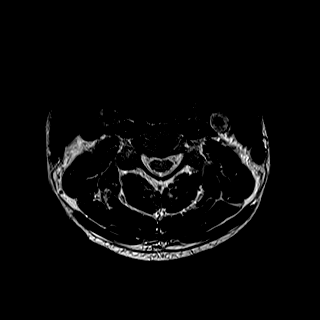
[im 23/30]
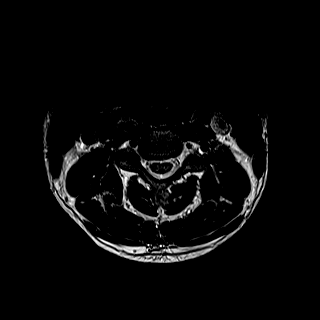
[im 25/30]
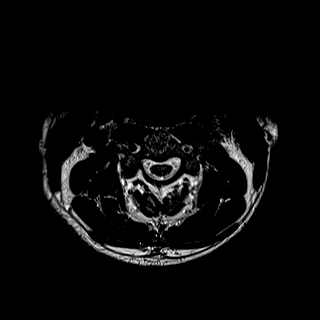
[im 30/30]
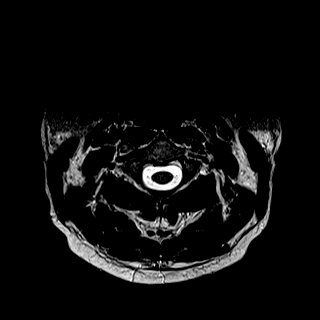

[Series 6: mpgr ax · axial · 3.0mm · 0.35mm/px · z∈[-78,+30]mm · 8 of 30 slices shown]
[im 1/30]
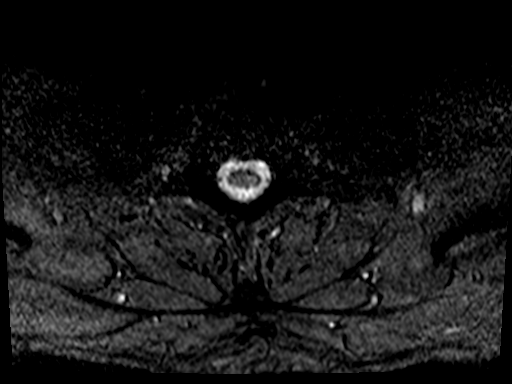
[im 5/30]
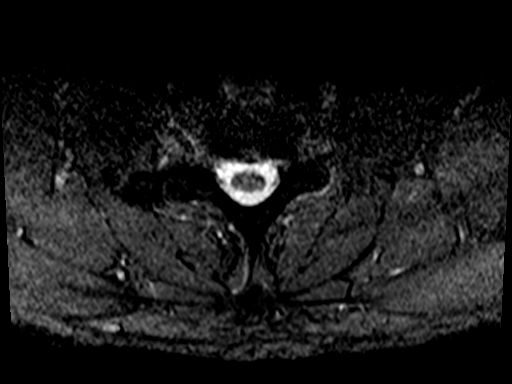
[im 9/30]
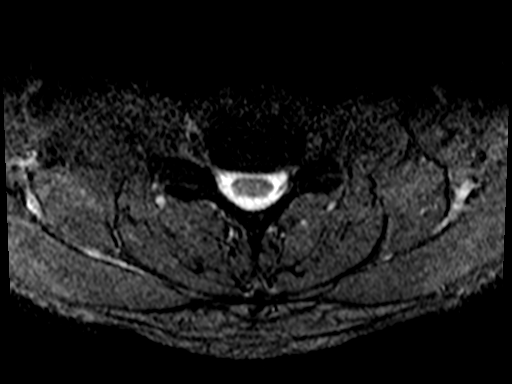
[im 13/30]
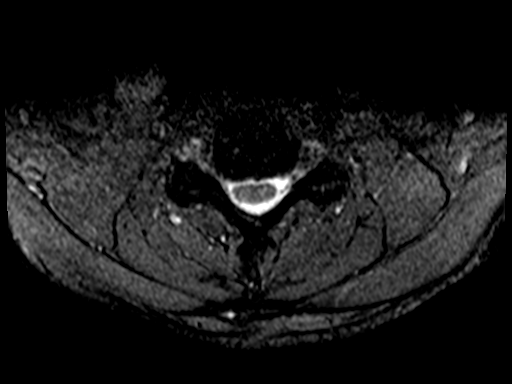
[im 17/30]
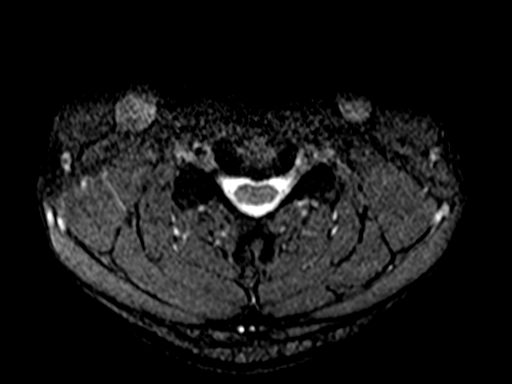
[im 21/30]
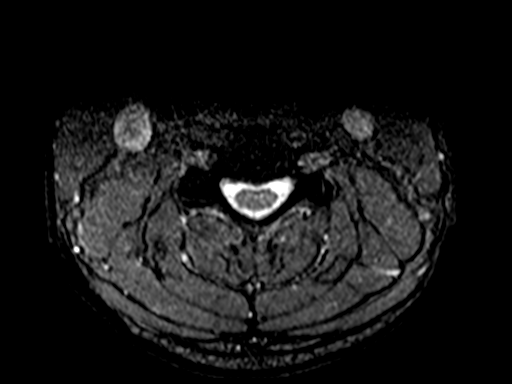
[im 25/30]
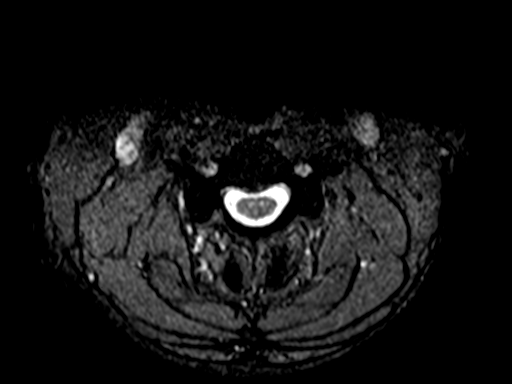
[im 30/30]
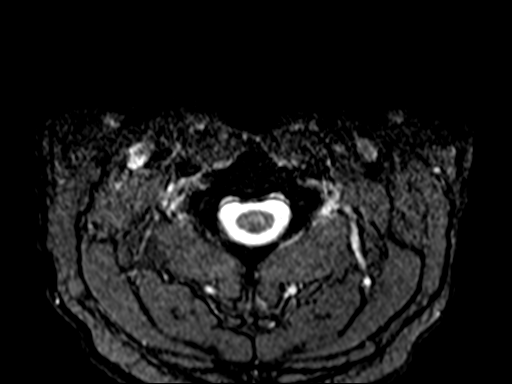

[40 of 48 positions shown; findings below may reference images not displayed]

FINDINGS: Alignment: Cervical spine straightening.  No listhesis.

Vertebrae: No fracture or suspicious osseous lesion. Moderate disc
space narrowing at C5-6 and C6-7 with associated degenerative
endplate changes including minimal degenerative edema.

Cord: Normal signal.

Posterior Fossa, vertebral arteries, paraspinal tissues:
Unremarkable.

Disc levels:

C2-3: Negative.

C3-4: Mild disc bulging and asymmetric right uncovertebral spurring
result in moderate to severe right neural foraminal stenosis without
spinal stenosis.

C4-5: Mild disc bulging and right uncovertebral spurring result in
mild right neural foraminal stenosis without spinal stenosis.

C5-6: Disc bulging and prominent asymmetric right uncovertebral
spurring result in moderate to severe right neural foraminal
stenosis. Disc slightly flattens the ventral spinal cord without
significant spinal stenosis.

C6-7: Broad-based posterior disc osteophyte complex results in mild
right and moderate left neural foraminal stenosis without spinal
stenosis.

C7-T1: Mild facet arthrosis without disc herniation or stenosis.
IMPRESSION: 1. Multilevel cervical disc degeneration without significant spinal
stenosis.
2. Moderate to severe right neural foraminal stenosis at C3-4 and
C5-6.
3. Moderate left neural foraminal stenosis at C6-7.

## 2022-07-21 ENCOUNTER — Other Ambulatory Visit: Payer: Self-pay

## 2022-07-21 DIAGNOSIS — E782 Mixed hyperlipidemia: Secondary | ICD-10-CM

## 2022-07-21 DIAGNOSIS — J302 Other seasonal allergic rhinitis: Secondary | ICD-10-CM

## 2022-07-21 MED ORDER — FLUTICASONE PROPIONATE 50 MCG/ACT NA SUSP: 2.0000 | Freq: Every day | NASAL | 3 refills | Status: DC

## 2022-07-21 MED ORDER — SIMVASTATIN 20 MG PO TABS: 20.0000 mg | ORAL_TABLET | Freq: Every day | ORAL | 3 refills | Status: DC

## 2022-09-22 DIAGNOSIS — R109 Unspecified abdominal pain: Secondary | ICD-10-CM | POA: Diagnosis not present

## 2022-09-22 DIAGNOSIS — I493 Ventricular premature depolarization: Secondary | ICD-10-CM | POA: Diagnosis not present

## 2022-09-22 DIAGNOSIS — R1084 Generalized abdominal pain: Secondary | ICD-10-CM | POA: Diagnosis not present

## 2022-09-22 DIAGNOSIS — R1032 Left lower quadrant pain: Secondary | ICD-10-CM | POA: Diagnosis not present

## 2022-09-22 DIAGNOSIS — R1013 Epigastric pain: Secondary | ICD-10-CM | POA: Diagnosis not present

## 2022-09-22 DIAGNOSIS — R1900 Intra-abdominal and pelvic swelling, mass and lump, unspecified site: Secondary | ICD-10-CM | POA: Diagnosis not present

## 2022-09-22 DIAGNOSIS — K5792 Diverticulitis of intestine, part unspecified, without perforation or abscess without bleeding: Secondary | ICD-10-CM | POA: Diagnosis not present

## 2022-10-01 ENCOUNTER — Ambulatory Visit (INDEPENDENT_AMBULATORY_CARE_PROVIDER_SITE_OTHER): Payer: Self-pay

## 2022-10-01 DIAGNOSIS — Z23 Encounter for immunization: Secondary | ICD-10-CM

## 2022-10-10 DIAGNOSIS — M5442 Lumbago with sciatica, left side: Secondary | ICD-10-CM | POA: Diagnosis not present

## 2022-10-10 DIAGNOSIS — G8929 Other chronic pain: Secondary | ICD-10-CM | POA: Diagnosis not present

## 2022-10-10 DIAGNOSIS — S86812A Strain of other muscle(s) and tendon(s) at lower leg level, left leg, initial encounter: Secondary | ICD-10-CM | POA: Diagnosis not present

## 2022-10-10 DIAGNOSIS — M545 Low back pain, unspecified: Secondary | ICD-10-CM | POA: Diagnosis not present

## 2022-11-11 ENCOUNTER — Other Ambulatory Visit: Payer: Self-pay | Admitting: Orthopaedic Surgery

## 2022-11-11 DIAGNOSIS — M79662 Pain in left lower leg: Secondary | ICD-10-CM | POA: Diagnosis not present

## 2022-11-11 DIAGNOSIS — S82202A Unspecified fracture of shaft of left tibia, initial encounter for closed fracture: Secondary | ICD-10-CM

## 2022-11-12 DIAGNOSIS — L820 Inflamed seborrheic keratosis: Secondary | ICD-10-CM | POA: Diagnosis not present

## 2022-11-12 DIAGNOSIS — X32XXXA Exposure to sunlight, initial encounter: Secondary | ICD-10-CM | POA: Diagnosis not present

## 2022-11-12 DIAGNOSIS — L720 Epidermal cyst: Secondary | ICD-10-CM | POA: Diagnosis not present

## 2022-11-12 DIAGNOSIS — D485 Neoplasm of uncertain behavior of skin: Secondary | ICD-10-CM | POA: Diagnosis not present

## 2022-11-12 DIAGNOSIS — L570 Actinic keratosis: Secondary | ICD-10-CM | POA: Diagnosis not present

## 2022-11-14 DIAGNOSIS — M79662 Pain in left lower leg: Secondary | ICD-10-CM | POA: Diagnosis not present

## 2022-11-17 ENCOUNTER — Other Ambulatory Visit: Payer: 59

## 2022-11-18 DIAGNOSIS — M79662 Pain in left lower leg: Secondary | ICD-10-CM | POA: Diagnosis not present

## 2022-12-24 ENCOUNTER — Ambulatory Visit: Payer: Self-pay

## 2022-12-24 DIAGNOSIS — Z0000 Encounter for general adult medical examination without abnormal findings: Secondary | ICD-10-CM

## 2022-12-24 LAB — POCT URINALYSIS DIPSTICK
Bilirubin, UA: NEGATIVE
Blood, UA: NEGATIVE
Glucose, UA: NEGATIVE
Ketones, UA: NEGATIVE
Leukocytes, UA: NEGATIVE
Nitrite, UA: NEGATIVE
Protein, UA: NEGATIVE
Spec Grav, UA: 1.015 (ref 1.010–1.025)
Urobilinogen, UA: 0.2 E.U./dL
pH, UA: 6 (ref 5.0–8.0)

## 2022-12-25 LAB — CMP12+LP+TP+TSH+6AC+PSA+CBC…
ALT: 28 IU/L (ref 0–44)
AST: 26 IU/L (ref 0–40)
Albumin/Globulin Ratio: 2.6 — ABNORMAL HIGH (ref 1.2–2.2)
Albumin: 4.4 g/dL (ref 3.8–4.9)
Alkaline Phosphatase: 54 IU/L (ref 44–121)
BUN/Creatinine Ratio: 20 (ref 9–20)
BUN: 20 mg/dL (ref 6–24)
Basophils Absolute: 0 10*3/uL (ref 0.0–0.2)
Basos: 1 %
Bilirubin Total: 0.7 mg/dL (ref 0.0–1.2)
Calcium: 9.3 mg/dL (ref 8.7–10.2)
Chloride: 102 mmol/L (ref 96–106)
Chol/HDL Ratio: 3.2 ratio (ref 0.0–5.0)
Cholesterol, Total: 190 mg/dL (ref 100–199)
Creatinine, Ser: 1 mg/dL (ref 0.76–1.27)
EOS (ABSOLUTE): 0.2 10*3/uL (ref 0.0–0.4)
Eos: 4 %
Estimated CHD Risk: <0.5 times avg. (ref 0.0–1.0)
Free Thyroxine Index: 1.6 (ref 1.2–4.9)
GGT: 15 IU/L (ref 0–65)
Globulin, Total: 1.7 g/dL (ref 1.5–4.5)
Glucose: 89 mg/dL (ref 70–99)
HDL: 59 mg/dL (ref >39)
Hematocrit: 42.3 % (ref 37.5–51.0)
Hemoglobin: 14.3 g/dL (ref 13.0–17.7)
Immature Grans (Abs): 0 10*3/uL (ref 0.0–0.1)
Immature Granulocytes: 0 %
Iron: 113 ug/dL (ref 38–169)
LDH: 177 IU/L (ref 121–224)
LDL Chol Calc (NIH): 113 mg/dL — ABNORMAL HIGH (ref 0–99)
Lymphocytes Absolute: 1.2 10*3/uL (ref 0.7–3.1)
Lymphs: 27 %
MCH: 31.2 pg (ref 26.6–33.0)
MCHC: 33.8 g/dL (ref 31.5–35.7)
MCV: 92 fL (ref 79–97)
Monocytes Absolute: 0.3 10*3/uL (ref 0.1–0.9)
Monocytes: 8 %
Neutrophils Absolute: 2.7 10*3/uL (ref 1.4–7.0)
Neutrophils: 60 %
Phosphorus: 3.5 mg/dL (ref 2.8–4.1)
Platelets: 214 10*3/uL (ref 150–450)
Potassium: 4.1 mmol/L (ref 3.5–5.2)
Prostate Specific Ag, Serum: 0.5 ng/mL (ref 0.0–4.0)
RBC: 4.59 x10E6/uL (ref 4.14–5.80)
RDW: 12.5 % (ref 11.6–15.4)
Sodium: 140 mmol/L (ref 134–144)
T3 Uptake Ratio: 29 % (ref 24–39)
T4, Total: 5.6 ug/dL (ref 4.5–12.0)
TSH: 1.06 u[IU]/mL (ref 0.450–4.500)
Total Protein: 6.1 g/dL (ref 6.0–8.5)
Triglycerides: 98 mg/dL (ref 0–149)
Uric Acid: 6.2 mg/dL (ref 3.8–8.4)
VLDL Cholesterol Cal: 18 mg/dL (ref 5–40)
WBC: 4.5 10*3/uL (ref 3.4–10.8)
eGFR: 87 mL/min/{1.73_m2} (ref >59)

## 2022-12-30 ENCOUNTER — Encounter: Payer: Self-pay | Admitting: Physician Assistant

## 2022-12-30 ENCOUNTER — Ambulatory Visit: Payer: Self-pay | Admitting: Physician Assistant

## 2022-12-30 ENCOUNTER — Other Ambulatory Visit: Payer: Self-pay | Admitting: Physician Assistant

## 2022-12-30 VITALS — BP 122/78 | HR 54 | Temp 97.6°F | Resp 12 | Ht 70.0 in | Wt 196.0 lb

## 2022-12-30 DIAGNOSIS — Z0000 Encounter for general adult medical examination without abnormal findings: Secondary | ICD-10-CM

## 2022-12-30 MED ORDER — SIMVASTATIN 10 MG PO TABS: 10.0000 mg | ORAL_TABLET | Freq: Every day | ORAL | 3 refills | Status: DC

## 2022-12-30 MED ORDER — AMOXICILLIN-POT CLAVULANATE 875-125 MG PO TABS: 1.0000 | ORAL_TABLET | Freq: Two times a day (BID) | ORAL | 0 refills | Status: DC

## 2022-12-30 MED ORDER — FINASTERIDE 5 MG PO TABS: 5.0000 mg | ORAL_TABLET | Freq: Every day | ORAL | 2 refills | Status: DC

## 2022-12-30 NOTE — Progress Notes (Signed)
[facility]  ____________________________________________   None    (approximate)  I have reviewed the triage vital signs and the nursing notes.   HISTORY  Chief Complaint Annual Exam   HPI Perry Richardson is a 57 y.o. male who present for annual exam. Reports generalized mild joint pain and moderate localized left knee pain and left calf muscle pain. Also requested finasteride refill while he switches to new urologist. Also reported x6 diverticulitis flare up last years that were treated with Augmentin and diet changes, requested abx in case flare up reoccurs.    Past Medical History:  Diagnosis Date   Atrial fibrillation [facility]) Oct 2014   one episode   Benign prostatic hyperplasia 07/31/2020   Diverticulitis    Hyperlipidemia    Sleep apnea    CPAP    Patient Active Problem List   Diagnosis Date Noted   Seasonal rhinitis 07/25/2021   Benign prostatic hyperplasia 07/31/2020   Low testosterone 07/31/2020   Ventricular ectopy 08/31/2015   Family history of colonic polyps 04/05/2015   Esophageal spasm 04/05/2015   Essential tremor 08/14/2014   Memory difficulty 08/14/2014   Atrial fibrillation ([place]) 07/25/2011   Obstructive sleep apnea 07/25/2011   Hyperlipidemia 07/25/2011    Past Surgical History:  Procedure Laterality Date   COLONOSCOPY WITH PROPOFOL N/A 05/30/2015   Procedure: COLONOSCOPY WITH PROPOFOL;  Surgeon: Robert Bellow, MD;  Location: [facility] ENDOSCOPY;  Service: Endoscopy;  Laterality: N/A;   COLONOSCOPY WITH PROPOFOL N/A 12/28/2019   Procedure: COLONOSCOPY WITH PROPOFOL;  Surgeon: Robert Bellow, MD;  Location: [facility] ENDOSCOPY;  Service: Endoscopy;  Laterality: N/A;   ESOPHAGOGASTRODUODENOSCOPY N/A 05/30/2015   Procedure: ESOPHAGOGASTRODUODENOSCOPY (EGD);  Surgeon: Robert Bellow, MD;  Location: [facility] ENDOSCOPY;  Service: Endoscopy;  Laterality: N/A;   MANDIBLE FRACTURE SURGERY     MOLE REMOVAL     NASAL SINUS  SURGERY     x2   TONSILLECTOMY      Prior to Admission medications   Medication Sig Start Date End Date Taking? Authorizing Provider  Cyanocobalamin 1000 MCG TBCR Take 1,000 mcg by mouth daily.    Yes [provider]  finasteride (PROSCAR) 5 MG tablet Take 5 mg by mouth daily.  06/17/13  Yes [provider]  flecainide (TAMBOCOR) 50 MG tablet Take 1 tablet (50 mg total) by mouth 2 (two) times daily as needed (for tachycardia). 09/05/20  Yes Gollan, Kathlene November, MD  fluticasone (FLONASE) 50 MCG/ACT nasal spray Place 2 sprays into both nostrils daily. 07/21/22  Yes Sable Feil, PA-C  metoprolol tartrate (LOPRESSOR) 25 MG tablet Take 1 tablet (25 mg total) by mouth 2 (two) times daily as needed (for tachycardia). 09/05/20  Yes Gollan, Kathlene November, MD  NON FORMULARY CPAP machine   Yes [provider]  simvastatin (ZOCOR) 20 MG tablet Take 1 tablet (20 mg total) by mouth at bedtime. 07/21/22  Yes Sable Feil, PA-C  Vitamin D, Ergocalciferol, (DRISDOL) 1.25 MG (50000 UNIT) CAPS capsule Take 50,000 Units by mouth once. 10/18/20  Yes [provider]  vitamin E (VITAMIN E) 180 MG (400 UNITS) capsule Take 1 capsule (400 Units total) by mouth in the morning and at bedtime. 10/18/20  Yes Sable Feil, PA-C    Allergies Patient has no known allergies.  Family History  Problem Relation Age of Onset   COPD Mother    Cancer Mother        kidney   Hypertension Father  Prostate cancer Father    Cancer Other 25       niece/rectal cancer   Colon polyps Daughter 8       colon polyp   Prostate cancer Brother     Social History Social History   Tobacco Use   Smoking status: Never   Smokeless tobacco: Never  Substance Use Topics   Alcohol use: Yes    Alcohol/week: 0.0 standard drinks of alcohol    Comment: rare, 2/week   Drug use: No    Review of Systems Constitutional: No fever/chills Eyes: No visual changes. ENT: No sore throat. Cardiovascular:  Denies chest pain.  History of atrial fibrillation Respiratory: Denies shortness of breath.  Obstructive sleep apnea Gastrointestinal: No abdominal pain.  No nausea, no vomiting.  No diarrhea.  No constipation. Reports hx of multiple diverticulitis flare ups.  Genitourinary: Negative for dysuria. BPH.  Musculoskeletal: Negative for back pain. Intermittent joint and muscle pain.  Skin: Negative for rash. Neurological: Negative for headaches, focal weakness or numbness.  Difficulty with memory. Endocrine: Hyperlipidemia, improved with medication.   ____________________________________________ PHYSICAL EXAM:  VITAL SIGNS: BP 122/78, P 54, T 97.29F, SpO2 100%, WT 196lb, BMI 28.1 Constitutional: Alert and oriented. Well appearing and in no acute distress. Eyes: Conjunctivae are normal. PERRL. EOMI. Head: Atraumatic. Nose: No congestion/rhinnorhea. Mouth/Throat: Mucous membranes are moist.  Oropharynx non-erythematous. Neck: No stridor. No cervical spine tenderness to palpation. Hematological/Lymphatic/Immunilogical: No cervical lymphadenopathy. Cardiovascular: Normal rate, regular rhythm. Grossly normal heart sounds.  Good peripheral circulation. Respiratory: Normal respiratory effort.  No retractions. Lungs CTAB. Gastrointestinal: Soft and nontender. No distention. No abdominal bruits. No CVA tenderness. Musculoskeletal: No lower extremity tenderness nor edema.  No joint effusions. Neurologic:  Normal speech and language. No gross focal neurologic deficits are appreciated. No gait instability. Skin:  Skin is warm, dry and intact. No rash noted. Psychiatric: Mood and affect are normal. Speech and behavior are normal. ____________________________________________   LABS        Component Ref Range & Units 6 d ago 1 yr ago 2 yr ago 3 yr ago  Color, UA Amber yellow Dark Yellow Yellow  Clarity, UA Clear clear Clear Clear  Glucose, UA Negative Negative Negative Negative Negative  Bilirubin,  UA Negative negative Negative Negative  Ketones, UA Negative negative Negative Negative  Spec Grav, UA 1.010 - 1.025 1.015 1.015 1.020 1.015  Blood, UA Negative negative Negative Negative  pH, UA 5.0 - 8.0 6.0 7.5 6.5 7.5  Protein, UA Negative Negative Positive Abnormal  CM Negative Negative  Urobilinogen, UA 0.2 or 1.0 E.U./dL 0.2 0.2 0.2 0.2  Nitrite, UA Negative negative Negative Negative  Leukocytes, UA Negative Negative Negative Negative Negative  Appearance  medium    Odor                     Component Ref Range & Units 6 d ago (12/24/22) 1 yr ago (10/31/21) 1 yr ago (04/22/21) 2 yr ago (10/30/20) 2 yr ago (09/27/20) 2 yr ago (09/27/20) 3 yr ago (10/19/19)  Glucose 70 - 99 mg/dL 89 87  91 R 112 High  CM  91 R  Uric Acid 3.8 - 8.4 mg/dL 6.2 5.5 CM  5.7 CM   6.1 CM  Comment:            Therapeutic target for gout patients: <6.0  BUN 6 - 24 mg/dL 20 20  13 22  High  R  19  Creatinine, Ser 0.76 - 1.27 mg/dL 1.00  0.97 1.00 R 1.08 1.06 R  1.02  eGFR >59 mL/min/1.73 87 91       BUN/Creatinine Ratio 9 - 20 20 21  High   12   19  Sodium 134 - 144 mmol/L 140 139  141 140 R  140  Potassium 3.5 - 5.2 mmol/L 4.1 4.2  4.2 4.1 R  4.3  [place] 96 - 106 mmol/L 102 101  104 105 R  103  Calcium 8.7 - 10.2 mg/dL 9.3 9.2  8.9 9.1 R  9.3  Phosphorus 2.8 - 4.1 mg/dL 3.5 3.3  3.4   3.6  Total Protein 6.0 - 8.5 g/dL 6.1 6.4  6.1 6.8 R  5.9 Low   Albumin 3.8 - 4.9 g/dL 4.4 4.5  4.2 4.0 R  4.2  Globulin, Total 1.5 - 4.5 g/dL 1.7 1.9  1.9   1.7  Albumin/Globulin Ratio 1.2 - 2.2 2.6 High  2.4 High   2.2   2.5 High   Bilirubin Total 0.0 - 1.2 mg/dL 0.7 0.5  0.6 0.9 R  0.5  Alkaline Phosphatase 44 - 121 IU/L 54 58  53 43 R  53 R  LDH 121 - 224 IU/L 177 192  183   170  AST 0 - 40 IU/L 26 27  19 18  R  20  ALT 0 - 44 IU/L 28 32  30 26 R  31  GGT 0 - 65 IU/L 15 15  24   18   Iron 38 - 169 ug/dL 113 90  106   141  Cholesterol, Total 100 - 199 mg/dL 190 190  206 High    210 High    Triglycerides 0 - 149 mg/dL 98 130  178 High    149  HDL >39 mg/dL 59 51  45   50  VLDL Cholesterol Cal 5 - 40 mg/dL 18 23  32   27  LDL Chol Calc (NIH) 0 - 99 mg/dL 113 High  116 High   129 High    133 High   Chol/HDL Ratio 0.0 - 5.0 ratio 3.2 3.7 CM  4.6 CM   4.2 CM  Comment:                                   T. Chol/HDL Ratio                                             Men  Women                               1/2 Avg.Risk  3.4    3.3                                   Avg.Risk  5.0    4.4                                2X Avg.Risk  9.6    7.1  3X Avg.Risk 23.4   11.0  Estimated CHD Risk 0.0 - 1.0 times avg.  < 0.5 0.6 CM  0.9 CM   0.8 CM  Comment: The CHD Risk is based on the T. Chol/HDL ratio. Other factors affect CHD Risk such as hypertension, smoking, diabetes, severe obesity, and family history of premature CHD.  TSH 0.450 - 4.500 uIU/mL 1.060 1.330  1.100   1.160  T4, Total 4.5 - 12.0 ug/dL 5.6 5.5  5.4   4.9  T3 Uptake Ratio 24 - 39 % 29 26  27   25   Free Thyroxine Index 1.2 - 4.9 1.6 1.4  1.5   1.2  Prostate Specific Ag, Serum 0.0 - 4.0 ng/mL 0.5 1.3 CM  0.5 CM   0.7 CM  Comment: Roche ECLIA methodology. According to the American Urological Association, Serum PSA should decrease and remain at undetectable levels after radical prostatectomy. The AUA defines biochemical recurrence as an initial PSA value 0.2 ng/mL or greater followed by a subsequent confirmatory PSA value 0.2 ng/mL or greater. Values obtained with different assay methods or kits cannot be used interchangeably. Results cannot be interpreted as absolute evidence of the presence or absence of malignant disease.  WBC 3.4 - 10.8 x10E3/uL 4.5 5.2  5.6  10.8 High  R 5.5  RBC 4.14 - 5.80 x10E6/uL 4.59 4.99  4.65  4.75 R 4.83  Hemoglobin 13.0 - 17.7 g/dL 14.3 15.6  15.1  15.4 R 14.9  Hematocrit 37.5 - 51.0 % 42.3 45.3  42.6  43.8 R 42.6  MCV 79 - 97 fL 92 91  92  92.2 R  88  MCH 26.6 - 33.0 pg 31.2 31.3  32.5  32.4 R 30.8  MCHC 31.5 - 35.7 g/dL 33.8 34.4  35.4  35.2 R 35.0  RDW 11.6 - 15.4 % 12.5 12.3  13.0  12.5 R 13.3  Platelets 150 - 450 x10E3/uL 214 244  204  190 R 203  Neutrophils Not Estab. % 60 63  57   61  Lymphs Not Estab. % 27 25  29   27   Monocytes Not Estab. % 8 6  8   7   Eos Not Estab. % 4 5  5   4   Basos Not Estab. % 1 1  1   1   Neutrophils Absolute 1.4 - 7.0 x10E3/uL 2.7 3.3  3.2   3.3  Lymphocytes Absolute 0.7 - 3.1 x10E3/uL 1.2 1.3  1.7   1.5  Monocytes Absolute 0.1 - 0.9 x10E3/uL 0.3 0.3  0.5   0.4  EOS (ABSOLUTE) 0.0 - 0.4 x10E3/uL 0.2 0.3  0.3   0.2  Basophils Absolute 0.0 - 0.2 x10E3/uL 0.0 0.1  0.1   0.1  Immature Granulocytes Not Estab. % 0 0  0   0                ____________________________________________  EKG Sinus bradycardia 55 bpm ____________________________________________  INITIAL IMPRESSION / ASSESSMENT AND PLAN  As part of my medical decision making, I reviewed the following data within the electronic MEDICAL RECORD NUMBER   No acute findings on physical exam. Plan is to decrease simvastatin to 10mg  and f/u in 54mo to reevaluate lipid panel and evaluate joint & muscle pain. Finasteride 5mg  to be refilled and order for Augmentin placed in case of diverticulitis flare up.         ____________________________________________   FINAL CLINICAL IMPRESSION Well visit   ED Discharge Orders     None  Note:  This document was prepared using Dragon voice recognition software and may include unintentional dictation errors.

## 2022-12-30 NOTE — Progress Notes (Signed)
Pt presents today to complete physical, Pt complains of joint pain.

## 2023-01-12 ENCOUNTER — Ambulatory Visit
Admission: RE | Admit: 2023-01-12 | Discharge: 2023-01-12 | Disposition: A | Discharge status: Home / Self Care | Payer: 59 | Attending: Physician Assistant | Admitting: Physician Assistant

## 2023-01-12 ENCOUNTER — Ambulatory Visit: Payer: Self-pay | Admitting: Physician Assistant

## 2023-01-12 ENCOUNTER — Encounter: Payer: Self-pay | Admitting: Physician Assistant

## 2023-01-12 ENCOUNTER — Ambulatory Visit
Admission: RE | Admit: 2023-01-12 | Discharge: 2023-01-12 | Disposition: A | Discharge status: Home / Self Care | Payer: 59 | Source: Ambulatory Visit | Attending: Physician Assistant | Admitting: Physician Assistant

## 2023-01-12 DIAGNOSIS — R062 Wheezing: Secondary | ICD-10-CM

## 2023-01-12 DIAGNOSIS — R059 Cough, unspecified: Secondary | ICD-10-CM | POA: Diagnosis not present

## 2023-01-12 MED ORDER — PROMETHAZINE-DM 6.25-15 MG/5ML PO SYRP: 5.0000 mL | ORAL_SOLUTION | Freq: Four times a day (QID) | ORAL | 0 refills | Status: DC | PRN

## 2023-01-12 NOTE — Progress Notes (Signed)
   Subjective: COVID-19    Patient ID: Perry Richardson, male    DOB: Jun 16, 1966, 57 y.o.   MRN: [id number]  HPI Patient complain of cough and wheezing status post positive home test for COVID-19 on 01/08/2023.  Patient states low-grade fever and bodyaches.  Review of Systems Negative except for complaint   Objective:   Physical Exam Deferred secondary to this being a telephonic encounter       Assessment & Plan: Cough and wheezing   Patient sent for chest x-ray.  Patient given prescription for Phenergan DM.  Will follow-up status post x-ray report.

## 2023-01-12 NOTE — Progress Notes (Signed)
Tested positive 3/28 Cough but not productive, states he has an audible wheezing in "trachea" as patient describes,- last fever yesterday morning low grade.

## 2023-01-14 ENCOUNTER — Other Ambulatory Visit: Payer: Self-pay | Admitting: Physician Assistant

## 2023-01-14 MED ORDER — AZITHROMYCIN 250 MG PO TABS: ORAL_TABLET | ORAL | 0 refills | Status: AC

## 2023-03-04 ENCOUNTER — Other Ambulatory Visit: Payer: Self-pay | Admitting: Physician Assistant

## 2023-03-04 MED ORDER — DICYCLOMINE HCL 20 MG PO TABS: 20.0000 mg | ORAL_TABLET | Freq: Four times a day (QID) | ORAL | 0 refills | Status: DC

## 2023-03-04 MED ORDER — METRONIDAZOLE 500 MG PO TABS: 500.0000 mg | ORAL_TABLET | Freq: Three times a day (TID) | ORAL | 0 refills | Status: AC

## 2023-03-04 MED ORDER — AMOXICILLIN-POT CLAVULANATE 875-125 MG PO TABS: 1.0000 | ORAL_TABLET | Freq: Two times a day (BID) | ORAL | 0 refills | Status: DC

## 2023-03-05 DIAGNOSIS — N3289 Other specified disorders of bladder: Secondary | ICD-10-CM | POA: Diagnosis not present

## 2023-03-05 DIAGNOSIS — R1032 Left lower quadrant pain: Secondary | ICD-10-CM | POA: Diagnosis not present

## 2023-03-05 DIAGNOSIS — K5732 Diverticulitis of large intestine without perforation or abscess without bleeding: Secondary | ICD-10-CM | POA: Diagnosis not present

## 2023-03-19 ENCOUNTER — Telehealth: Payer: Self-pay

## 2023-03-20 ENCOUNTER — Other Ambulatory Visit: Payer: Self-pay | Admitting: Physician Assistant

## 2023-03-20 MED ORDER — METRONIDAZOLE 500 MG PO TABS: 500.0000 mg | ORAL_TABLET | Freq: Three times a day (TID) | ORAL | 0 refills | Status: AC

## 2023-03-24 NOTE — Telephone Encounter (Signed)
Message sent to provider about Flagyl request 03/19/23.

## 2023-04-22 DIAGNOSIS — D225 Melanocytic nevi of trunk: Secondary | ICD-10-CM | POA: Diagnosis not present

## 2023-04-22 DIAGNOSIS — Z85828 Personal history of other malignant neoplasm of skin: Secondary | ICD-10-CM | POA: Diagnosis not present

## 2023-04-22 DIAGNOSIS — D2261 Melanocytic nevi of right upper limb, including shoulder: Secondary | ICD-10-CM | POA: Diagnosis not present

## 2023-04-22 DIAGNOSIS — D2262 Melanocytic nevi of left upper limb, including shoulder: Secondary | ICD-10-CM | POA: Diagnosis not present

## 2023-04-22 DIAGNOSIS — D2272 Melanocytic nevi of left lower limb, including hip: Secondary | ICD-10-CM | POA: Diagnosis not present

## 2023-04-22 DIAGNOSIS — L570 Actinic keratosis: Secondary | ICD-10-CM | POA: Diagnosis not present

## 2023-06-10 ENCOUNTER — Other Ambulatory Visit: Payer: Self-pay

## 2023-06-10 DIAGNOSIS — B370 Candidal stomatitis: Secondary | ICD-10-CM

## 2023-06-10 MED ORDER — FLUCONAZOLE 150 MG PO TABS
150.0000 mg | ORAL_TABLET | Freq: Once | ORAL | 0 refills | Status: AC
Start: 2023-06-10 — End: 2023-06-10

## 2023-06-16 DIAGNOSIS — K635 Polyp of colon: Secondary | ICD-10-CM | POA: Diagnosis not present

## 2023-06-16 DIAGNOSIS — K640 First degree hemorrhoids: Secondary | ICD-10-CM | POA: Diagnosis not present

## 2023-06-16 DIAGNOSIS — K5730 Diverticulosis of large intestine without perforation or abscess without bleeding: Secondary | ICD-10-CM | POA: Diagnosis not present

## 2023-06-16 DIAGNOSIS — D128 Benign neoplasm of rectum: Secondary | ICD-10-CM | POA: Diagnosis not present

## 2023-06-16 DIAGNOSIS — D127 Benign neoplasm of rectosigmoid junction: Secondary | ICD-10-CM | POA: Diagnosis not present

## 2023-06-16 DIAGNOSIS — D125 Benign neoplasm of sigmoid colon: Secondary | ICD-10-CM | POA: Diagnosis not present

## 2023-06-16 DIAGNOSIS — D122 Benign neoplasm of ascending colon: Secondary | ICD-10-CM | POA: Diagnosis not present

## 2023-06-16 DIAGNOSIS — Z86010 Personal history of colonic polyps: Secondary | ICD-10-CM | POA: Diagnosis not present

## 2023-06-16 DIAGNOSIS — G4733 Obstructive sleep apnea (adult) (pediatric): Secondary | ICD-10-CM | POA: Diagnosis not present

## 2023-06-16 DIAGNOSIS — K5732 Diverticulitis of large intestine without perforation or abscess without bleeding: Secondary | ICD-10-CM | POA: Diagnosis not present

## 2023-06-16 DIAGNOSIS — Z86018 Personal history of other benign neoplasm: Secondary | ICD-10-CM | POA: Diagnosis not present

## 2023-06-16 DIAGNOSIS — D123 Benign neoplasm of transverse colon: Secondary | ICD-10-CM | POA: Diagnosis not present

## 2023-07-01 ENCOUNTER — Other Ambulatory Visit: Payer: Self-pay | Admitting: Physician Assistant

## 2023-07-01 DIAGNOSIS — J302 Other seasonal allergic rhinitis: Secondary | ICD-10-CM

## 2023-07-07 DIAGNOSIS — K5732 Diverticulitis of large intestine without perforation or abscess without bleeding: Secondary | ICD-10-CM | POA: Insufficient documentation

## 2023-07-14 DIAGNOSIS — M722 Plantar fascial fibromatosis: Secondary | ICD-10-CM | POA: Diagnosis not present

## 2023-09-14 ENCOUNTER — Other Ambulatory Visit: Payer: Self-pay

## 2023-09-14 DIAGNOSIS — J302 Other seasonal allergic rhinitis: Secondary | ICD-10-CM

## 2023-09-14 MED ORDER — FLUTICASONE PROPIONATE 50 MCG/ACT NA SUSP
2.0000 | Freq: Every day | NASAL | 3 refills | Status: DC
Start: 2023-09-14 — End: 2024-06-02

## 2023-09-30 DIAGNOSIS — I4891 Unspecified atrial fibrillation: Secondary | ICD-10-CM | POA: Diagnosis not present

## 2023-09-30 DIAGNOSIS — K5732 Diverticulitis of large intestine without perforation or abscess without bleeding: Secondary | ICD-10-CM | POA: Diagnosis not present

## 2023-09-30 DIAGNOSIS — Z01818 Encounter for other preprocedural examination: Secondary | ICD-10-CM | POA: Diagnosis not present

## 2023-10-29 ENCOUNTER — Other Ambulatory Visit: Payer: Self-pay | Admitting: Physician Assistant

## 2023-10-29 MED ORDER — CIPROFLOXACIN HCL 500 MG PO TABS
500.0000 mg | ORAL_TABLET | Freq: Two times a day (BID) | ORAL | 0 refills | Status: DC
Start: 2023-10-29 — End: 2023-11-18

## 2023-10-29 MED ORDER — METRONIDAZOLE 500 MG PO TABS: 500.0000 mg | ORAL_TABLET | Freq: Three times a day (TID) | ORAL | 0 refills | Status: AC

## 2023-11-12 ENCOUNTER — Ambulatory Visit (INDEPENDENT_AMBULATORY_CARE_PROVIDER_SITE_OTHER): Payer: Self-pay

## 2023-11-12 DIAGNOSIS — Z23 Encounter for immunization: Secondary | ICD-10-CM

## 2023-11-18 ENCOUNTER — Ambulatory Visit: Payer: Self-pay | Admitting: Physician Assistant

## 2023-11-18 ENCOUNTER — Encounter: Payer: Self-pay | Admitting: Physician Assistant

## 2023-11-18 VITALS — BP 120/81 | HR 74 | Temp 98.2°F | Resp 12 | Ht 70.0 in | Wt 193.0 lb

## 2023-11-18 DIAGNOSIS — K5731 Diverticulosis of large intestine without perforation or abscess with bleeding: Secondary | ICD-10-CM

## 2023-11-18 MED ORDER — DICYCLOMINE HCL 20 MG PO TABS: 20.0000 mg | ORAL_TABLET | Freq: Four times a day (QID) | ORAL | 2 refills | Status: AC

## 2023-11-18 NOTE — Progress Notes (Signed)
   Subjective: Left lower stomach pain    Patient ID: Perry Richardson, male    DOB: [birth year], 58 y.o.   MRN: [id number]  HPI Patient complaining of left lower stomach pain for few days.  Described pain as achy.  Patient states in the past couple weeks she has had decreased bowel movements.  Patient does admit to having to bowel movements this morning.  Patient has a history of diverticulitis.  Patient was prescribed a combination of Flagyl  and Cipro  on 10/28/2023.  Patient is scheduled for laparoscopic with possible open sigmoid colectomy end of April of this year.   Review of Systems A-fib, diverticulitis, and obstructive sleep apnea.    Objective:   Physical Exam BP 120/81  BP Location Left Arm  Patient Position Sitting  Cuff Size Large  Pulse Rate 74  Temp 98.2 F (36.8 C)  Temp Source Temporal  Weight 193 lb (87.5 kg)  Height 5' 10 (1.778 m)  Resp 12  SpO2 99 %  No acute distress. Abdomen negative HSM, decreased bowel sounds left lower quadrant, Soft and Nontender to Palpation.       Assessment & Plan: Diverticulosis  Recommend Bentyl  and probiotics and follow-up in 2 weeks.

## 2023-11-18 NOTE — Progress Notes (Signed)
 Hx of diverticulits - scheduled for sigmoid colectomy with physician at [facility] - late April  Flagyl  & Cipro  - completed medication & w/in a week S/Sx returned - passing a lot of gas & nausea; lower left side pain Achy - not really painful.  Don't feel good.  Last CT was May 2024

## 2023-12-07 ENCOUNTER — Ambulatory Visit: Payer: Self-pay

## 2023-12-07 DIAGNOSIS — Z0000 Encounter for general adult medical examination without abnormal findings: Secondary | ICD-10-CM

## 2023-12-07 LAB — POCT URINALYSIS DIPSTICK
Bilirubin, UA: NEGATIVE
Blood, UA: NEGATIVE
Glucose, UA: NEGATIVE
Ketones, UA: NEGATIVE
Leukocytes, UA: NEGATIVE
Nitrite, UA: NEGATIVE
Protein, UA: NEGATIVE
Spec Grav, UA: <=1.005 — AB (ref 1.010–1.025)
Urobilinogen, UA: 0.2 U/dL
pH, UA: 7 (ref 5.0–8.0)

## 2023-12-08 LAB — CMP12+LP+TP+TSH+6AC+PSA+CBC…
ALT: 21 [IU]/L (ref 0–44)
AST: 19 [IU]/L (ref 0–40)
Albumin: 4.2 g/dL (ref 3.8–4.9)
Alkaline Phosphatase: 50 [IU]/L (ref 44–121)
BUN/Creatinine Ratio: 16 (ref 10–24)
BUN: 14 mg/dL (ref 8–27)
Basophils Absolute: 0.1 10*3/uL (ref 0.0–0.2)
Basos: 1 %
Bilirubin Total: 0.6 mg/dL (ref 0.0–1.2)
Calcium: 9 mg/dL (ref 8.6–10.2)
Chloride: 100 mmol/L (ref 96–106)
Chol/HDL Ratio: 3.5 {ratio} (ref 0.0–5.0)
Cholesterol, Total: 204 mg/dL — ABNORMAL HIGH (ref 100–199)
Creatinine, Ser: 0.88 mg/dL (ref 0.76–1.27)
EOS (ABSOLUTE): 0.2 10*3/uL (ref 0.0–0.4)
Eos: 4 %
Estimated CHD Risk: 0.6 times avg. (ref 0.0–1.0)
Free Thyroxine Index: 1.6 (ref 1.2–4.9)
GGT: 17 [IU]/L (ref 0–65)
Globulin, Total: 1.9 g/dL (ref 1.5–4.5)
Glucose: 81 mg/dL (ref 70–99)
HDL: 58 mg/dL (ref >39)
Hematocrit: 42.7 % (ref 37.5–51.0)
Hemoglobin: 14.4 g/dL (ref 13.0–17.7)
Immature Grans (Abs): 0 10*3/uL (ref 0.0–0.1)
Immature Granulocytes: 0 %
Iron: 99 ug/dL (ref 38–169)
LDH: 169 [IU]/L (ref 121–224)
LDL Chol Calc (NIH): 128 mg/dL — ABNORMAL HIGH (ref 0–99)
Lymphocytes Absolute: 1.5 10*3/uL (ref 0.7–3.1)
Lymphs: 25 %
MCH: 31.5 pg (ref 26.6–33.0)
MCHC: 33.7 g/dL (ref 31.5–35.7)
MCV: 93 fL (ref 79–97)
Monocytes Absolute: 0.4 10*3/uL (ref 0.1–0.9)
Monocytes: 6 %
Neutrophils Absolute: 3.8 10*3/uL (ref 1.4–7.0)
Neutrophils: 64 %
Phosphorus: 3.3 mg/dL (ref 2.8–4.1)
Platelets: 206 10*3/uL (ref 150–450)
Potassium: 3.9 mmol/L (ref 3.5–5.2)
Prostate Specific Ag, Serum: 0.4 ng/mL (ref 0.0–4.0)
RBC: 4.57 x10E6/uL (ref 4.14–5.80)
RDW: 12.1 % (ref 11.6–15.4)
Sodium: 139 mmol/L (ref 134–144)
T3 Uptake Ratio: 28 % (ref 24–39)
T4, Total: 5.6 ug/dL (ref 4.5–12.0)
TSH: 1.83 u[IU]/mL (ref 0.450–4.500)
Total Protein: 6.1 g/dL (ref 6.0–8.5)
Triglycerides: 98 mg/dL (ref 0–149)
Uric Acid: 5.4 mg/dL (ref 3.8–8.4)
VLDL Cholesterol Cal: 18 mg/dL (ref 5–40)
WBC: 5.9 10*3/uL (ref 3.4–10.8)
eGFR: 98 mL/min/{1.73_m2} (ref >59)

## 2023-12-10 DIAGNOSIS — K5732 Diverticulitis of large intestine without perforation or abscess without bleeding: Secondary | ICD-10-CM | POA: Diagnosis not present

## 2023-12-14 ENCOUNTER — Encounter: Payer: Self-pay | Admitting: Physician Assistant

## 2023-12-14 ENCOUNTER — Ambulatory Visit: Payer: Self-pay | Admitting: Physician Assistant

## 2023-12-14 VITALS — BP 123/79 | Resp 14 | Ht 70.0 in | Wt 191.0 lb

## 2023-12-14 DIAGNOSIS — Z0000 Encounter for general adult medical examination without abnormal findings: Secondary | ICD-10-CM

## 2023-12-14 NOTE — Progress Notes (Signed)
 [facility] ____________________________________________   None    (approximate)  I have reviewed the triage vital signs and the nursing notes.   HISTORY  Chief Complaint Annual Exam   HPI Perry Richardson is a 58 y.o. male presents for annual physical exam.  Patient said he went to the emergency room 4 days ago secondary to left quadrant abdominal pain.  Patient has a history of diverticulitis/tuberculosis.  Patient was consulted to surgeon after CT scan and scheduled for surgery in April 2025.         Past Medical History:  Diagnosis Date   Atrial fibrillation [facility]) Oct 2014   one episode   Benign prostatic hyperplasia 07/31/2020   Diverticulitis    Hyperlipidemia    Sleep apnea    CPAP    Patient Active Problem List   Diagnosis Date Noted   Diverticulitis of large intestine without perforation or abscess without bleeding 07/07/2023   Seasonal rhinitis 07/25/2021   Benign prostatic hyperplasia 07/31/2020   Low testosterone 07/31/2020   Ventricular ectopy 08/31/2015   Family history of colonic polyps 04/05/2015   Esophageal spasm 04/05/2015   Essential tremor 08/14/2014   Memory difficulty 08/14/2014   Atrial fibrillation ([facility]) 07/25/2011   Obstructive sleep apnea 07/25/2011   Hyperlipidemia 07/25/2011    Past Surgical History:  Procedure Laterality Date   COLONOSCOPY WITH PROPOFOL N/A 05/30/2015   Procedure: COLONOSCOPY WITH PROPOFOL;  Surgeon: Earline Mayotte, MD;  Location: [facility] ENDOSCOPY;  Service: Endoscopy;  Laterality: N/A;   COLONOSCOPY WITH PROPOFOL N/A 12/28/2019   Procedure: COLONOSCOPY WITH PROPOFOL;  Surgeon: Earline Mayotte, MD;  Location: [facility] ENDOSCOPY;  Service: Endoscopy;  Laterality: N/A;   ESOPHAGOGASTRODUODENOSCOPY N/A 05/30/2015   Procedure: ESOPHAGOGASTRODUODENOSCOPY (EGD);  Surgeon: Earline Mayotte, MD;  Location: [facility] ENDOSCOPY;  Service: Endoscopy;  Laterality: N/A;   MANDIBLE FRACTURE SURGERY      MOLE REMOVAL     NASAL SINUS SURGERY     x2   TONSILLECTOMY      Prior to Admission medications   Medication Sig Start Date End Date Taking? Authorizing Provider  Cyanocobalamin 1000 MCG TBCR Take 1,000 mcg by mouth daily.     [provider]  dicyclomine (BENTYL) 20 MG tablet Take 1 tablet (20 mg total) by mouth every 6 (six) hours. 11/18/23   Joni Reining, PA-C  finasteride (PROSCAR) 5 MG tablet Take 1 tablet (5 mg total) by mouth daily. 12/30/22   Joni Reining, PA-C  flecainide (TAMBOCOR) 50 MG tablet Take 1 tablet (50 mg total) by mouth 2 (two) times daily as needed (for tachycardia). 09/05/20   Antonieta Iba, MD  fluticasone (FLONASE) 50 MCG/ACT nasal spray Place 2 sprays into both nostrils daily. 09/14/23   Joni Reining, PA-C  metoprolol tartrate (LOPRESSOR) 25 MG tablet Take 1 tablet (25 mg total) by mouth 2 (two) times daily as needed (for tachycardia). 09/05/20   Antonieta Iba, MD  simvastatin (ZOCOR) 10 MG tablet Take 1 tablet (10 mg total) by mouth at bedtime. 12/30/22   Joni Reining, PA-C  Vitamin D, Ergocalciferol, (DRISDOL) 1.25 MG (50000 UNIT) CAPS capsule Take 50,000 Units by mouth once. 10/18/20   [provider]  vitamin E (VITAMIN E) 180 MG (400 UNITS) capsule Take 1 capsule (400 Units total) by mouth in the morning and at bedtime. 10/18/20   Joni Reining, PA-C    Allergies Patient has no known allergies.  Family History  Problem Relation Age of Onset   COPD Mother    Cancer Mother        kidney   Hypertension Father    Prostate cancer Father    Cancer Other 25       niece/rectal cancer   Colon polyps Daughter 33       colon polyp   Prostate cancer Brother     Social History Social History   Tobacco Use   Smoking status: Never   Smokeless tobacco: Never  Substance Use Topics   Alcohol use: Yes    Alcohol/week: 0.0 standard drinks of alcohol    Comment: rare, 2/week   Drug use: No    Review of  Systems Constitutional: No fever/chills Eyes: No visual changes. ENT: No sore throat. Cardiovascular: Denies chest pain.  A-fib Respiratory: Denies shortness of breath.  Sleep apnea Gastrointestinal: No abdominal pain.  No nausea, no vomiting.  No diarrhea.  No constipation. Genitourinary: Negative for dysuria.  BPH Musculoskeletal: Negative for back pain. Skin: Negative for rash. Neurological: Negative for headaches, focal weakness or numbness. Endocrine: Hyperlipidemia ____________________________________________   PHYSICAL EXAM:  VITAL SIGNS: BP 123/79  Cuff Size Normal  Weight 191 lb (86.6 kg)  Height 5\' 10"  (1.778 m)  Resp 14  SpO2 97 %   BMI: 27.41 kg/m2  BSA: 2.07 m2   Constitutional: Alert and oriented. Well appearing and in no acute distress. Eyes: Conjunctivae are normal. PERRL. EOMI. Head: Atraumatic. Nose: No congestion/rhinnorhea. Mouth/Throat: Mucous membranes are moist.  Oropharynx non-erythematous. Neck: No stridor. No cervical spine tenderness to palpation. Hematological/Lymphatic/Immunilogical: No cervical lymphadenopathy. Cardiovascular: Normal rate, regular rhythm. Grossly normal heart sounds.  Good peripheral circulation. Respiratory: Normal respiratory effort.  No retractions. Lungs CTAB. Gastrointestinal: Soft and nontender. No distention. No abdominal bruits. No CVA tenderness. Genitourinary: Deferred Musculoskeletal: No lower extremity tenderness nor edema.  No joint effusions. Neurologic:  Normal speech and language. No gross focal neurologic deficits are appreciated. No gait instability. Skin:  Skin is warm, dry and intact. No rash noted. Psychiatric: Mood and affect are normal. Speech and behavior are normal.  ____________________________________________   LABS _        Component Ref Range & Units (hover) 7 d ago 11 mo ago 2 yr ago 3 yr ago 4 yr ago  Color, UA Light Yellow Amber yellow Dark Yellow Yellow  Clarity, UA Clear Clear clear  Clear Clear  Glucose, UA Negative Negative Negative Negative Negative  Bilirubin, UA Negative Negative negative Negative Negative  Ketones, UA Negative Negative negative Negative Negative  Spec Grav, UA <=1.005 Abnormal  1.015 1.015 1.020 1.015  Blood, UA Negative Negative negative Negative Negative  pH, UA 7.0 6.0 7.5 6.5 7.5  Protein, UA Negative Negative Positive Abnormal  CM Negative Negative  Urobilinogen, UA 0.2 0.2 0.2 0.2 0.2  Nitrite, UA Negative Negative negative Negative Negative  Leukocytes, UA Negative Negative Negative Negative Negative  Appearance   medium    Odor                  View All Conversations on this Encounter               Component Ref Range & Units (hover) 7 d ago (12/07/23) 11 mo ago (12/24/22) 2 yr ago (10/31/21) 2 yr ago (04/22/21) 3 yr ago (10/30/20) 3 yr ago (09/27/20) 3 yr ago (09/27/20)  Glucose 81 89 87  91 R 112 High  CM   Uric Acid 5.4 6.2 CM 5.5 CM  5.7 CM    Comment:            Therapeutic target for gout patients: <6.0  BUN 14 20 R 20 R  13 R 22 High  R   Creatinine, Ser 0.88 1.00 0.97 1.00 R 1.08 1.06 R   eGFR 98 87 91      BUN/Creatinine Ratio 16 20 R 21 High  R  12 R    Sodium 139 140 139  141 140 R   Potassium 3.9 4.1 4.2  4.2 4.1 R   [place] 100 102 101  104 105 R   Calcium 9.0 9.3 R 9.2 R  8.9 R 9.1 R   Phosphorus 3.3 3.5 3.3  3.4    Total Protein 6.1 6.1 6.4  6.1 6.8 R   Albumin 4.2 4.4 4.5  4.2 4.0 R   Globulin, Total 1.9 1.7 1.9  1.9    Bilirubin Total 0.6 0.7 0.5  0.6 0.9 R   Alkaline Phosphatase 50 54 58  53 43 R   LDH 169 177 192  183    AST 19 26 27  19 18  R   ALT 21 28 32  30 26 R   GGT 17 15 15  24     Iron 99 113 90  106    Cholesterol, Total 204 High  190 190  206 High     Triglycerides 98 98 130  178 High     HDL 58 59 51  45    VLDL Cholesterol Cal 18 18 23   32    LDL Chol Calc (NIH) 128 High  113 High  116 High   129 High     Chol/HDL Ratio 3.5 3.2 CM 3.7 CM  4.6 CM    Comment:                                    T. Chol/HDL Ratio                                             Men  Women                               1/2 Avg.Risk  3.4    3.3                                   Avg.Risk  5.0    4.4                                2X Avg.Risk  9.6    7.1                                3X Avg.Risk 23.4   11.0  Estimated CHD Risk 0.6  < 0.5 CM 0.6 CM  0.9 CM    Comment: The CHD Risk is based on the T. Chol/HDL ratio. Other factors affect CHD Risk such as hypertension, smoking, diabetes, severe obesity, and family history of premature CHD.  TSH 1.830 1.060 1.330  1.100  T4, Total 5.6 5.6 5.5  5.4    T3 Uptake Ratio 28 29 26  27     Free Thyroxine Index 1.6 1.6 1.4  1.5    Prostate Specific Ag, Serum 0.4 0.5 CM 1.3 CM  0.5 CM    Comment: Roche ECLIA methodology. According to the American Urological Association, Serum PSA should decrease and remain at undetectable levels after radical prostatectomy. The AUA defines biochemical recurrence as an initial PSA value 0.2 ng/mL or greater followed by a subsequent confirmatory PSA value 0.2 ng/mL or greater. Values obtained with different assay methods or kits cannot be used interchangeably. Results cannot be interpreted as absolute evidence of the presence or absence of malignant disease.  WBC 5.9 4.5 5.2  5.6  10.8 High  R  RBC 4.57 4.59 4.99  4.65  4.75 R  Hemoglobin 14.4 14.3 15.6  15.1  15.4 R  Hematocrit 42.7 42.3 45.3  42.6  43.8 R  MCV 93 92 91  92  92.2 R  MCH 31.5 31.2 31.3  32.5  32.4 R  MCHC 33.7 33.8 34.4  35.4  35.2 R  RDW 12.1 12.5 12.3  13.0  12.5 R  Platelets 206 214 244  204  190 R  Neutrophils 64 60 63  57    Lymphs 25 27 25  29     Monocytes 6 8 6  8     Eos 4 4 5  5     Basos 1 1 1  1     Neutrophils Absolute 3.8 2.7 3.3  3.2    Lymphocytes Absolute 1.5 1.2 1.3  1.7    Monocytes Absolute 0.4 0.3 0.3  0.5    EOS (ABSOLUTE) 0.2 0.2 0.3  0.3    Basophils Absolute 0.1 0.0 0.1  0.1    Immature Granulocytes 0 0 0  0    Immature  Grans (Abs) 0.0 0.0 0.0  0.0               ___________________________________________  EKG  Sinus bradycardia 57 bpm ____________________________________________    ____________________________________________   INITIAL IMPRESSION / ASSESSMENT AND PLAN  As part of my medical decision making, I reviewed the following data within the electronic MEDICAL RECORD NUMBER       No acute findings on physical exam, EKG, or labs.  Patient will follow-up with scheduled surgery for diverticulitis.     ____________________________________________   FINAL CLINICAL IMPRESSION 1 exam    ED Discharge Orders     None        Note:  This document was prepared using Dragon voice recognition software and may include unintentional dictation errors.

## 2023-12-14 NOTE — Progress Notes (Signed)
Pt presents today to complete physical./CL,RMA ?

## 2023-12-24 ENCOUNTER — Encounter: Payer: Self-pay | Admitting: Physician Assistant

## 2023-12-24 ENCOUNTER — Ambulatory Visit: Payer: Self-pay | Admitting: Physician Assistant

## 2023-12-24 VITALS — BP 112/74 | HR 73 | Temp 98.2°F | Resp 16

## 2023-12-24 DIAGNOSIS — B370 Candidal stomatitis: Secondary | ICD-10-CM

## 2023-12-24 MED ORDER — FLUCONAZOLE 150 MG PO TABS: 150.0000 mg | ORAL_TABLET | Freq: Every day | ORAL | 0 refills | Status: DC

## 2023-12-24 NOTE — Progress Notes (Signed)
   Subjective: Coated tongue    Patient ID: Perry Richardson, male    DOB: April 23, 1966, 58 y.o.   MRN: [id number]  HPI Patient complaining of 3 days of coating on tongue.  This is a recurrent condition for patient secondary to multi use of antibiotics for diverticulitis.  Patient requesting refill of medication (Diflucan).   Review of Systems     Objective:   Physical Exam BP 112/74  Pulse Rate 73  Temp 98.2 F (36.8 C)  Resp 16  SpO2 99 %  No acute distress. White coating on tongue consistent with oral candidiasis.       Assessment & Plan: Oral candidiasis   Patient prescribed Diflucan to take as directed.

## 2023-12-24 NOTE — Progress Notes (Signed)
 Stated many rounds of ABT with diverticulitis has him having thrush s/s with sore and read throat.

## 2024-01-04 DIAGNOSIS — B379 Candidiasis, unspecified: Secondary | ICD-10-CM | POA: Diagnosis not present

## 2024-01-04 DIAGNOSIS — Z91018 Allergy to other foods: Secondary | ICD-10-CM | POA: Diagnosis not present

## 2024-01-21 DIAGNOSIS — G4733 Obstructive sleep apnea (adult) (pediatric): Secondary | ICD-10-CM | POA: Diagnosis not present

## 2024-01-21 DIAGNOSIS — E785 Hyperlipidemia, unspecified: Secondary | ICD-10-CM | POA: Diagnosis not present

## 2024-01-21 DIAGNOSIS — I493 Ventricular premature depolarization: Secondary | ICD-10-CM | POA: Diagnosis not present

## 2024-01-21 DIAGNOSIS — K5732 Diverticulitis of large intestine without perforation or abscess without bleeding: Secondary | ICD-10-CM | POA: Diagnosis not present

## 2024-01-21 DIAGNOSIS — N401 Enlarged prostate with lower urinary tract symptoms: Secondary | ICD-10-CM | POA: Diagnosis not present

## 2024-01-21 DIAGNOSIS — G250 Essential tremor: Secondary | ICD-10-CM | POA: Diagnosis not present

## 2024-01-21 DIAGNOSIS — R351 Nocturia: Secondary | ICD-10-CM | POA: Diagnosis not present

## 2024-01-21 DIAGNOSIS — K224 Dyskinesia of esophagus: Secondary | ICD-10-CM | POA: Diagnosis not present

## 2024-01-21 DIAGNOSIS — I4891 Unspecified atrial fibrillation: Secondary | ICD-10-CM | POA: Diagnosis not present

## 2024-01-21 DIAGNOSIS — R413 Other amnesia: Secondary | ICD-10-CM | POA: Diagnosis not present

## 2024-01-29 ENCOUNTER — Ambulatory Visit: Payer: Self-pay | Admitting: Adult Health

## 2024-01-29 ENCOUNTER — Encounter: Payer: Self-pay | Admitting: Adult Health

## 2024-01-29 ENCOUNTER — Other Ambulatory Visit: Payer: Self-pay

## 2024-01-29 VITALS — BP 126/80 | HR 60 | Temp 96.3°F | Ht 70.0 in | Wt 190.0 lb

## 2024-01-29 DIAGNOSIS — B370 Candidal stomatitis: Secondary | ICD-10-CM

## 2024-01-29 MED ORDER — CLOTRIMAZOLE 10 MG MT TROC: 10.0000 mg | Freq: Every day | OROMUCOSAL | 0 refills | Status: AC

## 2024-01-29 NOTE — Progress Notes (Signed)
 [facility], [facility] [address] [place], [place] [address]   Office Visit Note  Patient Name: Perry Richardson Date of Birth [id number]  Medical Record number [id number]  Date of Service: 01/29/2024  Chief Complaint  Patient presents with   Thrush    Patient is concerned that he may have thrush. He has been taking oral antibiotics for treatment of diverticulitis and is scheduled to have a colectomy.      HPI Pt is here for a sick visit. He reports sore throat for a few weeks, and describes thrush symptoms. He has been on antibiotics for a few weeks for his diverticulitis.    Current Medication:  Outpatient Encounter Medications as of 01/29/2024  Medication Sig Note   clotrimazole  (MYCELEX ) 10 MG troche Take 1 tablet (10 mg total) by mouth 5 (five) times daily.    Cyanocobalamin 1000 MCG TBCR Take 1,000 mcg by mouth daily.     dicyclomine  (BENTYL ) 20 MG tablet Take 1 tablet (20 mg total) by mouth every 6 (six) hours.    finasteride  (PROSCAR ) 5 MG tablet Take 1 tablet (5 mg total) by mouth daily.    flecainide  (TAMBOCOR ) 50 MG tablet Take 1 tablet (50 mg total) by mouth 2 (two) times daily as needed (for tachycardia). 11/18/2023: Has for prn for A-Fib   fluticasone  (FLONASE ) 50 MCG/ACT nasal spray Place 2 sprays into both nostrils daily.    metoprolol  tartrate (LOPRESSOR ) 25 MG tablet Take 1 tablet (25 mg total) by mouth 2 (two) times daily as needed (for tachycardia). 11/18/2023: Has prn for A-Fib   simvastatin  (ZOCOR ) 10 MG tablet Take 1 tablet (10 mg total) by mouth at bedtime.    VITAMIN D  PO Take by mouth daily.    vitamin E  (VITAMIN E ) 180 MG (400 UNITS) capsule Take 1 capsule (400 Units total) by mouth in the morning and at bedtime. (Patient taking differently: Take 400 Units by mouth daily.)    [DISCONTINUED] fluconazole  (DIFLUCAN ) 150 MG tablet Take 1 tablet (150 mg total) by mouth daily.    [DISCONTINUED] Vitamin D , Ergocalciferol , (DRISDOL ) 1.25 MG (50000 UNIT) CAPS capsule  Take 50,000 Units by mouth once.    No facility-administered encounter medications on file as of 01/29/2024.      Medical History: Past Medical History:  Diagnosis Date   Atrial fibrillation [facility]) Oct 2014   one episode   Benign prostatic hyperplasia 07/31/2020   Diverticulitis    Hyperlipidemia    Sleep apnea    CPAP     Vital Signs: BP 126/80   Pulse 60   Temp (!) 96.3 F (35.7 C)   Ht 5\' 10"  (1.778 m)   Wt 190 lb (86.2 kg)   SpO2 100%   BMI 27.26 kg/m    Review of Systems  Constitutional:  Negative for chills, fatigue and fever.  HENT:  Positive for sore throat.   Eyes:  Negative for pain and itching.  Respiratory:  Negative for cough.   Cardiovascular:  Negative for chest pain.  Gastrointestinal:  Negative for diarrhea, nausea and vomiting.    Physical Exam Vitals reviewed.  Neurological:     Mental Status: He is alert.    Assessment/Plan: 1. Oral thrush (Primary) Use troche as discussed. Follow up via MyChart messenger if symptoms fail to improve or may return to clinic as needed for worsening symptoms.   - clotrimazole  (MYCELEX ) 10 MG troche; Take 1 tablet (10 mg total) by mouth 5 (five) times daily.  Dispense:  50 Troche; Refill: 0     General Counseling: Cullan verbalizes understanding of the findings of todays visit and agrees with plan of treatment. I have discussed any further diagnostic evaluation that may be needed or ordered today. We also reviewed his medications today. he has been encouraged to call the office with any questions or concerns that should arise related to todays visit.   No orders of the defined types were placed in this encounter.   Meds ordered this encounter  Medications   clotrimazole  (MYCELEX ) 10 MG troche    Sig: Take 1 tablet (10 mg total) by mouth 5 (five) times daily.    Dispense:  50 Troche    Refill:  0    Time spent:15 Minutes    Sheria Dills AGNP-C Nurse Practitioner

## 2024-02-03 DIAGNOSIS — I493 Ventricular premature depolarization: Secondary | ICD-10-CM | POA: Diagnosis not present

## 2024-02-03 DIAGNOSIS — Z466 Encounter for fitting and adjustment of urinary device: Secondary | ICD-10-CM | POA: Diagnosis not present

## 2024-02-03 DIAGNOSIS — N401 Enlarged prostate with lower urinary tract symptoms: Secondary | ICD-10-CM | POA: Diagnosis not present

## 2024-02-03 DIAGNOSIS — G4730 Sleep apnea, unspecified: Secondary | ICD-10-CM | POA: Diagnosis not present

## 2024-02-03 DIAGNOSIS — I4891 Unspecified atrial fibrillation: Secondary | ICD-10-CM | POA: Diagnosis not present

## 2024-02-03 DIAGNOSIS — Z85828 Personal history of other malignant neoplasm of skin: Secondary | ICD-10-CM | POA: Diagnosis not present

## 2024-02-03 DIAGNOSIS — Z79899 Other long term (current) drug therapy: Secondary | ICD-10-CM | POA: Diagnosis not present

## 2024-02-03 DIAGNOSIS — K5792 Diverticulitis of intestine, part unspecified, without perforation or abscess without bleeding: Secondary | ICD-10-CM | POA: Diagnosis not present

## 2024-02-03 DIAGNOSIS — K5730 Diverticulosis of large intestine without perforation or abscess without bleeding: Secondary | ICD-10-CM | POA: Diagnosis not present

## 2024-02-03 DIAGNOSIS — R413 Other amnesia: Secondary | ICD-10-CM | POA: Diagnosis not present

## 2024-02-03 DIAGNOSIS — G250 Essential tremor: Secondary | ICD-10-CM | POA: Diagnosis not present

## 2024-02-03 DIAGNOSIS — Z860101 Personal history of adenomatous and serrated colon polyps: Secondary | ICD-10-CM | POA: Diagnosis not present

## 2024-02-03 DIAGNOSIS — K224 Dyskinesia of esophagus: Secondary | ICD-10-CM | POA: Diagnosis not present

## 2024-02-03 DIAGNOSIS — E785 Hyperlipidemia, unspecified: Secondary | ICD-10-CM | POA: Diagnosis not present

## 2024-02-03 DIAGNOSIS — N400 Enlarged prostate without lower urinary tract symptoms: Secondary | ICD-10-CM | POA: Diagnosis not present

## 2024-02-03 DIAGNOSIS — K5732 Diverticulitis of large intestine without perforation or abscess without bleeding: Secondary | ICD-10-CM | POA: Diagnosis not present

## 2024-02-24 ENCOUNTER — Other Ambulatory Visit: Payer: Self-pay | Admitting: Physician Assistant

## 2024-02-24 DIAGNOSIS — E782 Mixed hyperlipidemia: Secondary | ICD-10-CM

## 2024-02-24 DIAGNOSIS — N400 Enlarged prostate without lower urinary tract symptoms: Secondary | ICD-10-CM

## 2024-02-29 DIAGNOSIS — Z9889 Other specified postprocedural states: Secondary | ICD-10-CM | POA: Diagnosis not present

## 2024-02-29 DIAGNOSIS — Z48815 Encounter for surgical aftercare following surgery on the digestive system: Secondary | ICD-10-CM | POA: Diagnosis not present

## 2024-03-19 DIAGNOSIS — M79662 Pain in left lower leg: Secondary | ICD-10-CM | POA: Diagnosis not present

## 2024-04-01 DIAGNOSIS — M1712 Unilateral primary osteoarthritis, left knee: Secondary | ICD-10-CM | POA: Diagnosis not present

## 2024-04-01 DIAGNOSIS — M79662 Pain in left lower leg: Secondary | ICD-10-CM | POA: Diagnosis not present

## 2024-05-10 DIAGNOSIS — D2262 Melanocytic nevi of left upper limb, including shoulder: Secondary | ICD-10-CM | POA: Diagnosis not present

## 2024-05-10 DIAGNOSIS — L570 Actinic keratosis: Secondary | ICD-10-CM | POA: Diagnosis not present

## 2024-05-10 DIAGNOSIS — D485 Neoplasm of uncertain behavior of skin: Secondary | ICD-10-CM | POA: Diagnosis not present

## 2024-05-10 DIAGNOSIS — D225 Melanocytic nevi of trunk: Secondary | ICD-10-CM | POA: Diagnosis not present

## 2024-05-10 DIAGNOSIS — D2261 Melanocytic nevi of right upper limb, including shoulder: Secondary | ICD-10-CM | POA: Diagnosis not present

## 2024-05-10 DIAGNOSIS — C44519 Basal cell carcinoma of skin of other part of trunk: Secondary | ICD-10-CM | POA: Diagnosis not present

## 2024-05-10 DIAGNOSIS — D2272 Melanocytic nevi of left lower limb, including hip: Secondary | ICD-10-CM | POA: Diagnosis not present

## 2024-05-10 DIAGNOSIS — Z85828 Personal history of other malignant neoplasm of skin: Secondary | ICD-10-CM | POA: Diagnosis not present

## 2024-06-01 ENCOUNTER — Other Ambulatory Visit: Payer: Self-pay | Admitting: Physician Assistant

## 2024-06-01 DIAGNOSIS — J302 Other seasonal allergic rhinitis: Secondary | ICD-10-CM

## 2024-06-15 DIAGNOSIS — D485 Neoplasm of uncertain behavior of skin: Secondary | ICD-10-CM | POA: Diagnosis not present

## 2024-06-15 DIAGNOSIS — R208 Other disturbances of skin sensation: Secondary | ICD-10-CM | POA: Diagnosis not present

## 2024-06-15 DIAGNOSIS — D0461 Carcinoma in situ of skin of right upper limb, including shoulder: Secondary | ICD-10-CM | POA: Diagnosis not present

## 2024-07-04 ENCOUNTER — Other Ambulatory Visit: Payer: Self-pay | Admitting: Physician Assistant

## 2024-07-04 DIAGNOSIS — E8801 Alpha-1-antitrypsin deficiency: Secondary | ICD-10-CM

## 2024-07-26 DIAGNOSIS — D0461 Carcinoma in situ of skin of right upper limb, including shoulder: Secondary | ICD-10-CM | POA: Diagnosis not present

## 2024-07-26 DIAGNOSIS — C44519 Basal cell carcinoma of skin of other part of trunk: Secondary | ICD-10-CM | POA: Diagnosis not present

## 2024-08-05 DIAGNOSIS — C44321 Squamous cell carcinoma of skin of nose: Secondary | ICD-10-CM | POA: Diagnosis not present

## 2024-08-08 DIAGNOSIS — Z48817 Encounter for surgical aftercare following surgery on the skin and subcutaneous tissue: Secondary | ICD-10-CM | POA: Diagnosis not present

## 2024-10-11 ENCOUNTER — Ambulatory Visit (INDEPENDENT_AMBULATORY_CARE_PROVIDER_SITE_OTHER): Payer: Self-pay

## 2024-10-11 DIAGNOSIS — Z23 Encounter for immunization: Secondary | ICD-10-CM

## 2024-10-20 ENCOUNTER — Ambulatory Visit (INDEPENDENT_AMBULATORY_CARE_PROVIDER_SITE_OTHER): Payer: Self-pay | Admitting: Adult Health

## 2024-10-20 ENCOUNTER — Other Ambulatory Visit: Payer: Self-pay

## 2024-10-20 ENCOUNTER — Encounter: Payer: Self-pay | Admitting: Adult Health

## 2024-10-20 VITALS — BP 110/84 | HR 85 | Temp 98.3°F | Ht 70.0 in | Wt 190.0 lb

## 2024-10-20 DIAGNOSIS — R0981 Nasal congestion: Secondary | ICD-10-CM

## 2024-10-20 DIAGNOSIS — Z8679 Personal history of other diseases of the circulatory system: Secondary | ICD-10-CM

## 2024-10-20 NOTE — Progress Notes (Signed)
 Therapist, [facility] [address] [place], [place] [address]   Office Visit Note  Patient Name: Perry Richardson Date of Birth [id number]  Medical Record number [id number]  Date of Service: 10/21/2024  Chief Complaint  Patient presents with   Acute Visit    Patient c/o nasal congestion and occasional hoarseness x 1 week. Denies fever. He tested negative for flu/COVID yesterday. He has been taking Tylenol PRN.     HPI Pt is here reports a history of A-fib and Pac's. He has not seen cardiology since 2021.  He has been doing well, and will intermittent take meds as prescribed by cards, when he feels symptoms.  He is requesting EKG today although he does not feel like he is in a-fib.  He has also had some mild congestion, and drainage.  Negative for covid and flu on home test.    Current Medication:  Outpatient Encounter Medications as of 10/20/2024  Medication Sig Note   Cyanocobalamin 1000 MCG TBCR Take 1,000 mcg by mouth daily.     finasteride  (PROSCAR ) 5 MG tablet TAKE ONE TABLET BY MOUTH EVERY DAY    flecainide  (TAMBOCOR ) 50 MG tablet Take 1 tablet (50 mg total) by mouth 2 (two) times daily as needed (for tachycardia). 11/18/2023: Has for prn for A-Fib   fluticasone  (FLONASE ) 50 MCG/ACT nasal spray SPRAY TWICE INTO EACH NOSTRIL ONCE DAILY    metoprolol  tartrate (LOPRESSOR ) 25 MG tablet Take 1 tablet (25 mg total) by mouth 2 (two) times daily as needed (for tachycardia). 11/18/2023: Has prn for A-Fib   simvastatin  (ZOCOR ) 10 MG tablet TAKE ONE TABLET BY MOUTH AT BEDTIME    VITAMIN D  PO Take by mouth daily.    vitamin E  (VITAMIN E ) 180 MG (400 UNITS) capsule Take 1 capsule (400 Units total) by mouth in the morning and at bedtime.    clotrimazole  (MYCELEX ) 10 MG troche Take 1 tablet (10 mg total) by mouth 5 (five) times daily. (Patient not taking: Reported on 10/20/2024)    dicyclomine  (BENTYL ) 20 MG tablet Take 1 tablet (20 mg total) by mouth every 6 (six) hours. (Patient not taking: Reported on  10/20/2024)    No facility-administered encounter medications on file as of 10/20/2024.      Medical History: Past Medical History:  Diagnosis Date   Atrial fibrillation [facility] Oct 2014   one episode   Benign prostatic hyperplasia 07/31/2020   Diverticulitis    Hyperlipidemia    Sleep apnea    CPAP     Vital Signs: BP 110/84   Pulse 85   Temp 98.3 F (36.8 C)   Ht 5' 10 (1.778 m)   Wt 190 lb (86.2 kg)   SpO2 98%   BMI 27.26 kg/m    Review of Systems  Constitutional:  Negative for chills, fatigue and fever.  HENT:  Positive for congestion and postnasal drip.   Eyes:  Negative for pain and itching.  Respiratory:  Negative for cough and choking.   Cardiovascular:  Negative for chest pain and palpitations.  Gastrointestinal:  Negative for diarrhea, nausea and vomiting.    Physical Exam Vitals reviewed.  Constitutional:      Appearance: Normal appearance.  HENT:     Head: Normocephalic.     Right Ear: Tympanic membrane and ear canal normal.     Left Ear: Tympanic membrane and ear canal normal.  Neurological:     Mental Status: He is alert.       Assessment/Plan: 1.  History of atrial fibrillation (Primary) EKG My read, NSR. No a-fib noted. Should see cardiology as discussed since he hasn't been seen in almost 5 years.  - EKG 12-Lead  2. Nasal congestion Continue current plan. Follow up via MyChart messenger if symptoms fail to improve or may return to clinic as needed for worsening symptoms.       General Counseling: Perry Richardson verbalizes understanding of the findings of todays visit and agrees with plan of treatment. I have discussed any further diagnostic evaluation that may be needed or ordered today. We also reviewed his medications today. he has been encouraged to call the office with any questions or concerns that should arise related to todays visit.   Orders Placed This Encounter  Procedures   EKG 12-Lead    No orders of the defined types were  placed in this encounter.   Time spent:15 Minutes    Juliene DOROTHA Howells AGNP-C Nurse Practitioner

## 2024-10-25 NOTE — Progress Notes (Unsigned)
 Cardiology Office Note  Date:  10/25/2024   ID:  Perry Richardson, DOB Nov 27, 1965, MRN [id number]  PCP:  Pcp, No   No chief complaint on file.   HPI:  Perry Richardson is a 59 y.o. male with past medical history of: Past Medical History:  Diagnosis Date   Atrial fibrillation [facility]) Oct 2014   one episode   Benign prostatic hyperplasia 07/31/2020   Diverticulitis    Hyperlipidemia    Sleep apnea    CPAP  Who presents by referral from employee health [name] paroxysmal atrial fibrillation    PMH:   has a past medical history of Atrial fibrillation [facility]) (Oct 2014), Benign prostatic hyperplasia (07/31/2020), Diverticulitis, Hyperlipidemia, and Sleep apnea.   PSH:    Past Surgical History:  Procedure Laterality Date   COLONOSCOPY WITH PROPOFOL  N/A 05/30/2015   Procedure: COLONOSCOPY WITH PROPOFOL ;  Surgeon: Reyes LELON Cota, MD;  Location: [facility] ENDOSCOPY;  Service: Endoscopy;  Laterality: N/A;   COLONOSCOPY WITH PROPOFOL  N/A 12/28/2019   Procedure: COLONOSCOPY WITH PROPOFOL ;  Surgeon: Cota Reyes LELON, MD;  Location: [facility] ENDOSCOPY;  Service: Endoscopy;  Laterality: N/A;   ESOPHAGOGASTRODUODENOSCOPY N/A 05/30/2015   Procedure: ESOPHAGOGASTRODUODENOSCOPY (EGD);  Surgeon: Reyes LELON Cota, MD;  Location: [facility] ENDOSCOPY;  Service: Endoscopy;  Laterality: N/A;   MANDIBLE FRACTURE SURGERY     MOLE REMOVAL     NASAL SINUS SURGERY     x2   TONSILLECTOMY      Current Outpatient Medications  Medication Sig Dispense Refill   clotrimazole  (MYCELEX ) 10 MG troche Take 1 tablet (10 mg total) by mouth 5 (five) times daily. (Patient not taking: Reported on 10/20/2024) 50 Troche 0   Cyanocobalamin 1000 MCG TBCR Take 1,000 mcg by mouth daily.      dicyclomine  (BENTYL ) 20 MG tablet Take 1 tablet (20 mg total) by mouth every 6 (six) hours. (Patient not taking: Reported on 10/20/2024) 20 tablet 2   finasteride  (PROSCAR ) 5 MG tablet TAKE ONE TABLET BY MOUTH EVERY DAY 90 tablet 3   flecainide   (TAMBOCOR ) 50 MG tablet Take 1 tablet (50 mg total) by mouth 2 (two) times daily as needed (for tachycardia). 60 tablet 3   fluticasone  (FLONASE ) 50 MCG/ACT nasal spray SPRAY TWICE INTO EACH NOSTRIL ONCE DAILY 48 g 3   metoprolol  tartrate (LOPRESSOR ) 25 MG tablet Take 1 tablet (25 mg total) by mouth 2 (two) times daily as needed (for tachycardia). 60 tablet 3   simvastatin  (ZOCOR ) 10 MG tablet TAKE ONE TABLET BY MOUTH AT BEDTIME 90 tablet 3   VITAMIN D  PO Take by mouth daily.     vitamin E  (VITAMIN E ) 180 MG (400 UNITS) capsule Take 1 capsule (400 Units total) by mouth in the morning and at bedtime. 60 capsule 6   No current facility-administered medications for this visit.     Allergies:   Patient has no known allergies.   Social History:  The patient  reports that he has never smoked. He has never used smokeless tobacco. He reports current alcohol use. He reports that he does not use drugs.   Family History:   family history includes COPD in his mother; Cancer in his mother; Cancer (age of onset: 42) in an other family member; Colon polyps (age of onset: 15) in his daughter; Hypertension in his father; Prostate cancer in his brother and father.    Review of Systems: ROS   PHYSICAL EXAM: VS:  There were no vitals taken for this visit. ,  BMI There is no height or weight on file to calculate BMI. GEN: Well nourished, well developed, in no acute distress HEENT: normal Neck: no JVD, carotid bruits, or masses Cardiac: RRR; no murmurs, rubs, or gallops,no edema  Respiratory:  clear to auscultation bilaterally, normal work of breathing GI: soft, nontender, nondistended, + BS MS: no deformity or atrophy Skin: warm and dry, no rash Neuro:  Strength and sensation are intact Psych: euthymic mood, full affect    Recent Labs: 12/07/2023: ALT 21; BUN 14; Creatinine, Ser 0.88; Hemoglobin 14.4; Platelets 206; Potassium 3.9; Sodium 139; TSH 1.830    Lipid Panel Lab Results  Component Value  Date   CHOL 204 (H) 12/07/2023   HDL 58 12/07/2023   LDLCALC 128 (H) 12/07/2023   TRIG 98 12/07/2023      Wt Readings from Last 3 Encounters:  10/20/24 190 lb (86.2 kg)  01/29/24 190 lb (86.2 kg)  12/14/23 191 lb (86.6 kg)       ASSESSMENT AND PLAN:  Problem List Items Addressed This Visit   None    Disposition:   F/U  12 months   Total encounter time more than 30 minutes  Greater than 50% was spent in counseling and coordination of care with the patient    Signed, Velinda Lunger, M.D., Ph.D. [facility] [place], [place] [phone]

## 2024-10-28 ENCOUNTER — Ambulatory Visit: Admitting: Cardiovascular Disease

## 2024-10-28 DIAGNOSIS — G4733 Obstructive sleep apnea (adult) (pediatric): Secondary | ICD-10-CM

## 2024-10-28 DIAGNOSIS — E782 Mixed hyperlipidemia: Secondary | ICD-10-CM

## 2024-10-28 DIAGNOSIS — I480 Paroxysmal atrial fibrillation: Secondary | ICD-10-CM

## 2024-11-14 ENCOUNTER — Ambulatory Visit: Admitting: Cardiovascular Disease

## 2024-11-28 ENCOUNTER — Ambulatory Visit: Admitting: Cardiovascular Disease
